# Patient Record
Sex: Male | Born: 1980
Health system: Southern US, Community
[De-identification: ages and names within clinical notes are randomized; demographics above are authoritative.]

## PROBLEM LIST (undated history)

## (undated) DIAGNOSIS — E079 Disorder of thyroid, unspecified: Secondary | ICD-10-CM

## (undated) DIAGNOSIS — E559 Vitamin D deficiency, unspecified: Secondary | ICD-10-CM

## (undated) DIAGNOSIS — T7840XA Allergy, unspecified, initial encounter: Secondary | ICD-10-CM

## (undated) HISTORY — DX: Vitamin D deficiency, unspecified: E55.9

## (undated) HISTORY — PX: NO PAST SURGERIES: SHX2092

## (undated) HISTORY — DX: Allergy, unspecified, initial encounter: T78.40XA

---

## 2006-05-06 ENCOUNTER — Ambulatory Visit: Payer: Self-pay | Admitting: Family Medicine

## 2006-05-16 ENCOUNTER — Ambulatory Visit: Payer: Self-pay | Admitting: Family Medicine

## 2006-11-07 ENCOUNTER — Ambulatory Visit: Payer: Self-pay | Admitting: Family Medicine

## 2006-12-04 DIAGNOSIS — F988 Other specified behavioral and emotional disorders with onset usually occurring in childhood and adolescence: Secondary | ICD-10-CM | POA: Insufficient documentation

## 2007-03-07 ENCOUNTER — Encounter: Payer: Self-pay | Admitting: Family Medicine

## 2007-04-11 ENCOUNTER — Ambulatory Visit: Payer: Self-pay | Admitting: Family Medicine

## 2007-04-14 ENCOUNTER — Telehealth (INDEPENDENT_AMBULATORY_CARE_PROVIDER_SITE_OTHER): Payer: Self-pay | Admitting: *Deleted

## 2007-04-14 LAB — CONVERTED CEMR LAB: T4, Total: 6.9 ug/dL (ref 5.0–12.5)

## 2007-04-17 ENCOUNTER — Encounter: Admission: RE | Admit: 2007-04-17 | Discharge: 2007-04-17 | Payer: Self-pay | Admitting: Family Medicine

## 2007-04-20 ENCOUNTER — Encounter (INDEPENDENT_AMBULATORY_CARE_PROVIDER_SITE_OTHER): Payer: Self-pay | Admitting: Family Medicine

## 2007-04-21 ENCOUNTER — Telehealth (INDEPENDENT_AMBULATORY_CARE_PROVIDER_SITE_OTHER): Payer: Self-pay | Admitting: *Deleted

## 2007-04-22 ENCOUNTER — Ambulatory Visit: Payer: Self-pay | Admitting: Family Medicine

## 2007-04-22 LAB — CONVERTED CEMR LAB
Basophils Absolute: 0 10*3/uL (ref 0.0–0.1)
Eosinophils Absolute: 0.2 10*3/uL (ref 0.0–0.6)
HCT: 43.6 % (ref 39.0–52.0)
Hemoglobin: 14.8 g/dL (ref 13.0–17.0)
Lymphocytes Relative: 29 % (ref 12.0–46.0)
MCHC: 34 g/dL (ref 30.0–36.0)
MCV: 85.3 fL (ref 78.0–100.0)
Monocytes Absolute: 0.6 10*3/uL (ref 0.2–0.7)
Neutro Abs: 3.4 10*3/uL (ref 1.4–7.7)
Neutrophils Relative %: 58.2 % (ref 43.0–77.0)
RBC: 5.11 M/uL (ref 4.22–5.81)

## 2007-04-23 ENCOUNTER — Telehealth (INDEPENDENT_AMBULATORY_CARE_PROVIDER_SITE_OTHER): Payer: Self-pay | Admitting: *Deleted

## 2007-06-19 ENCOUNTER — Ambulatory Visit: Payer: Self-pay | Admitting: Family Medicine

## 2007-06-24 ENCOUNTER — Telehealth (INDEPENDENT_AMBULATORY_CARE_PROVIDER_SITE_OTHER): Payer: Self-pay | Admitting: *Deleted

## 2007-08-05 IMAGING — US US SOFT TISSUE HEAD/NECK
1 series · 14 of 25 positions shown · non-contrast
Comparison: None.

CLINICAL DATA: Elevated TSH.

THYROID ULTRASOUND
TECHNIQUE: Ultrasound examination of the thyroid gland and adjacent soft tissue
structures was performed.

[Series 1: unknown · 0.09mm/px · 14 of 50 slices shown]
[im 1/50]
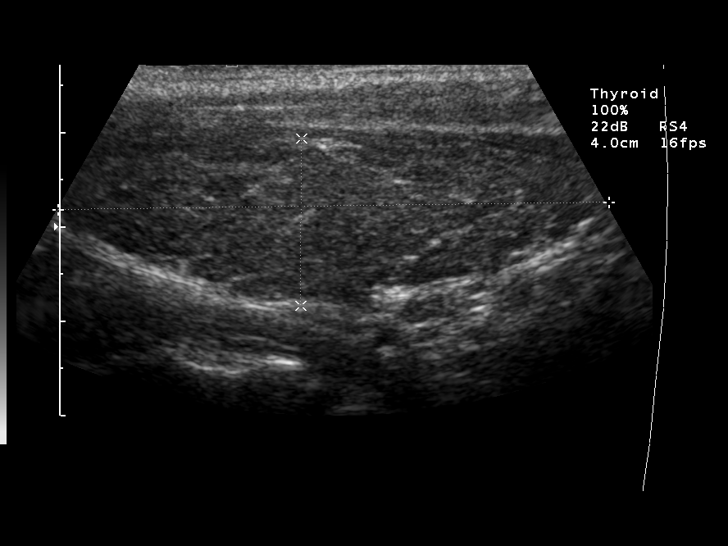
[im 5/50]
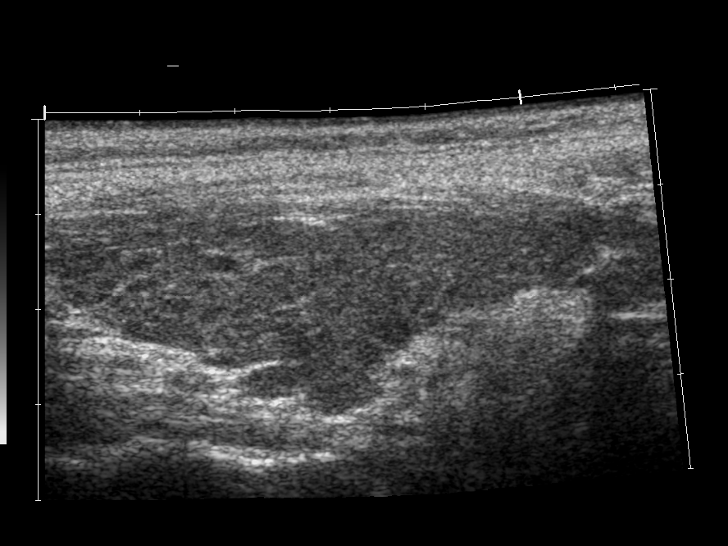
[im 9/50]
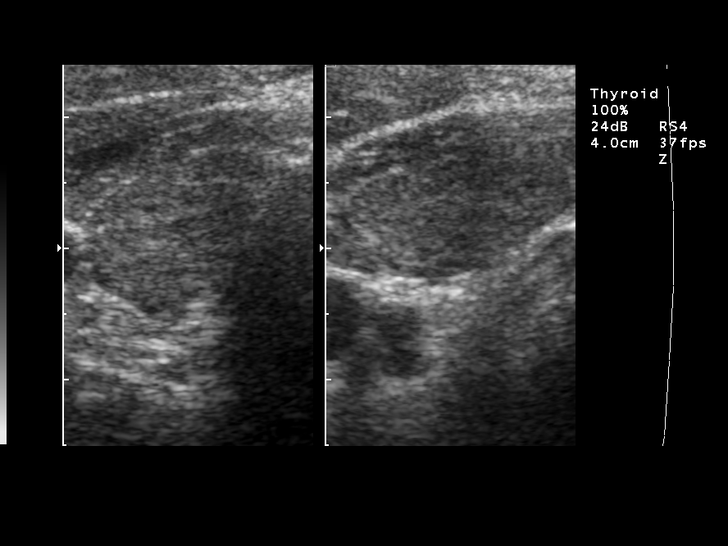
[im 13/50]
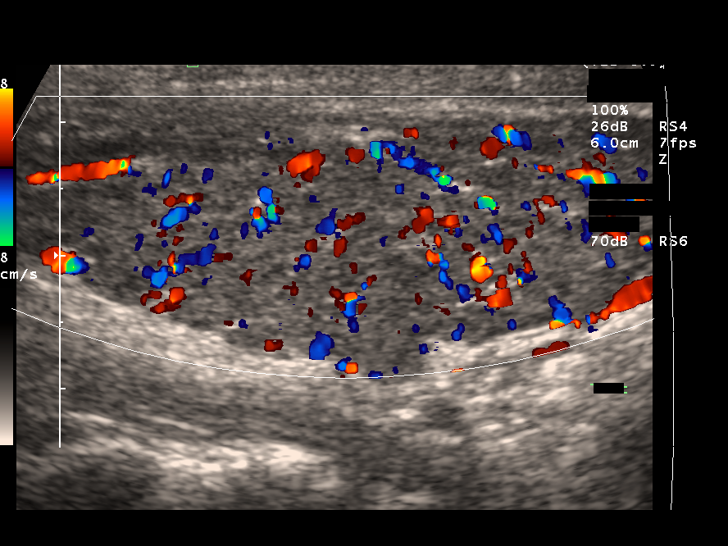
[im 17/50]
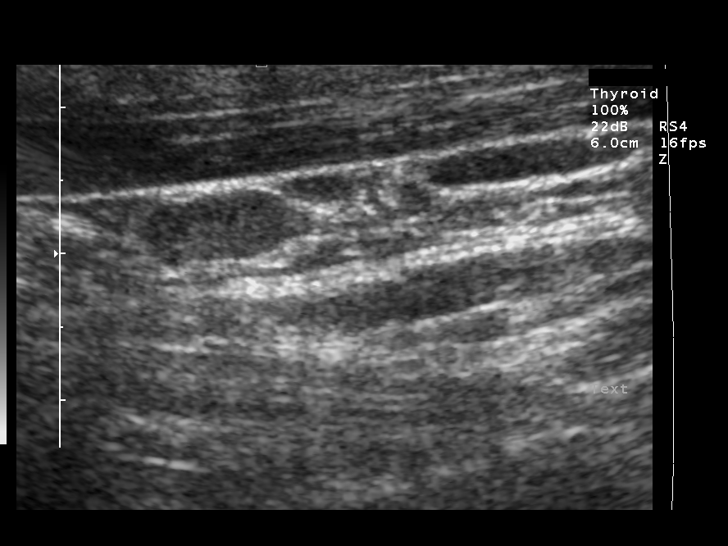
[im 19/50]
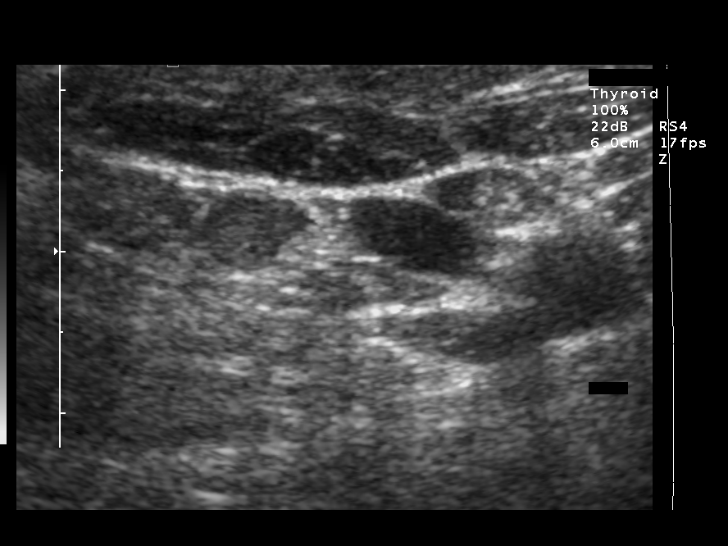
[im 23/50]
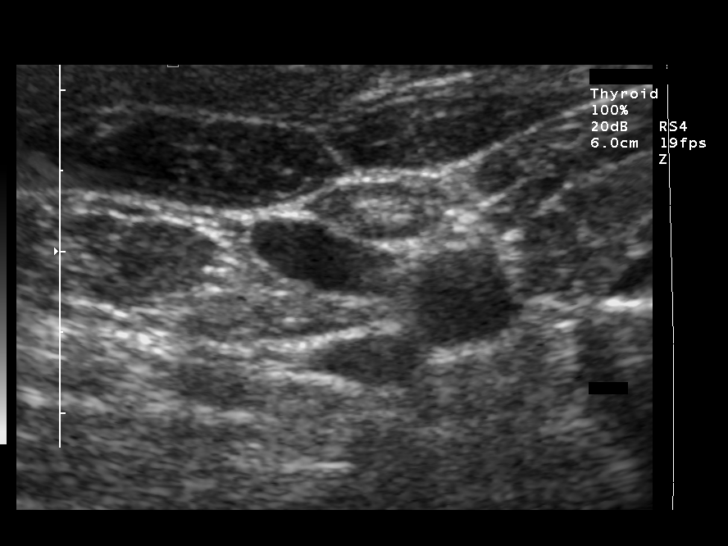
[im 27/50]
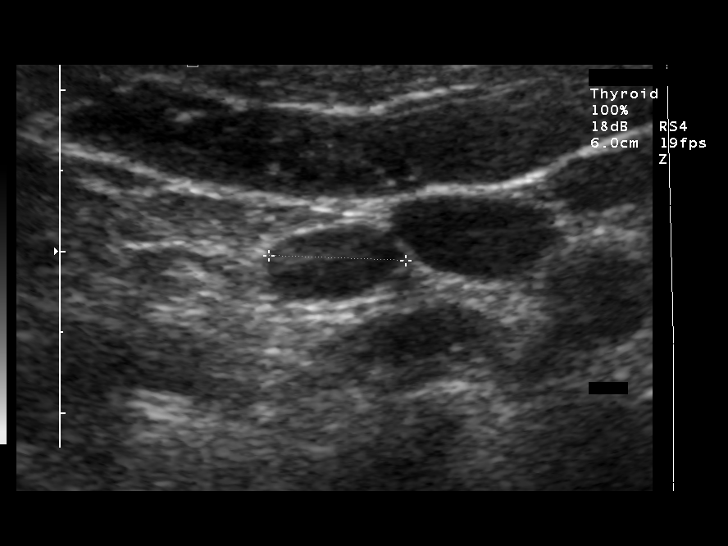
[im 31/50]
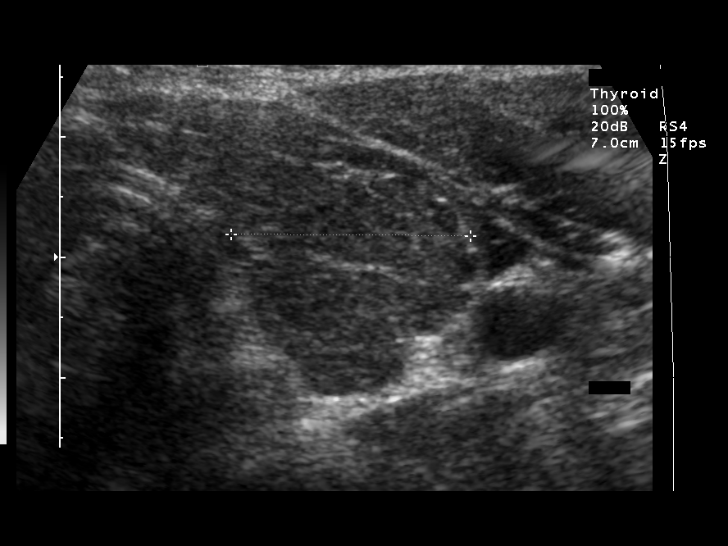
[im 33/50]
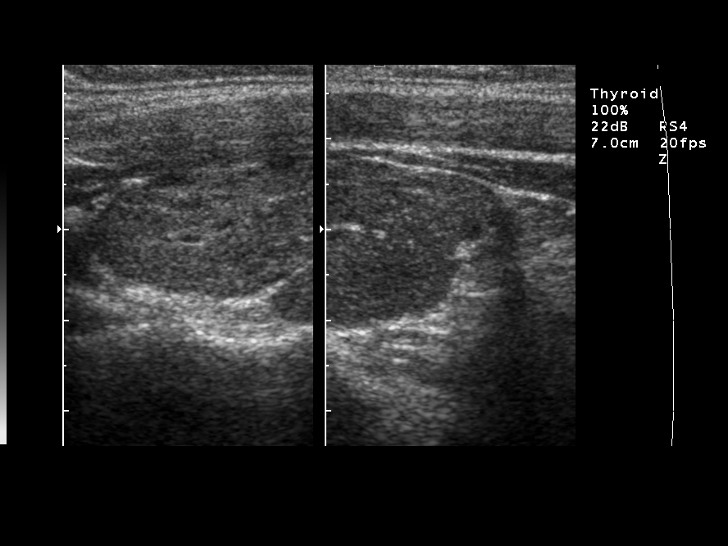
[im 37/50]
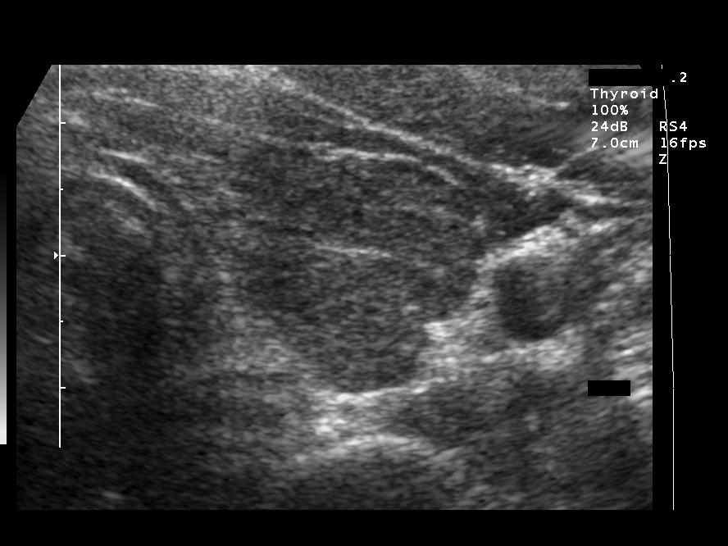
[im 41/50]
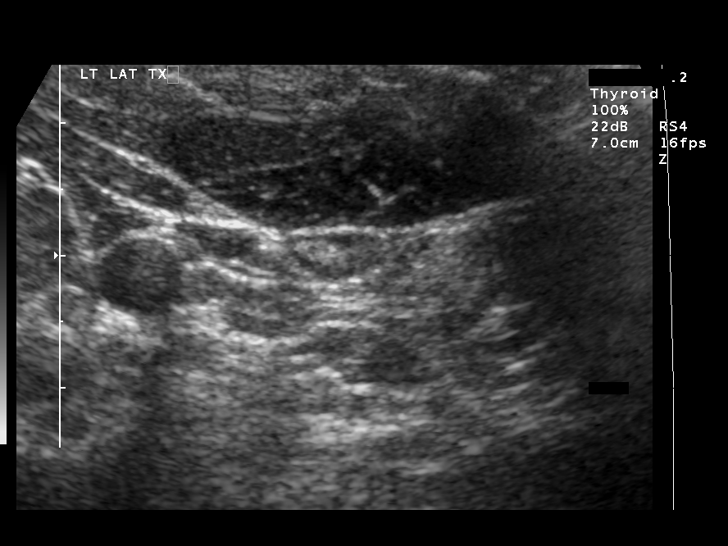
[im 45/50]
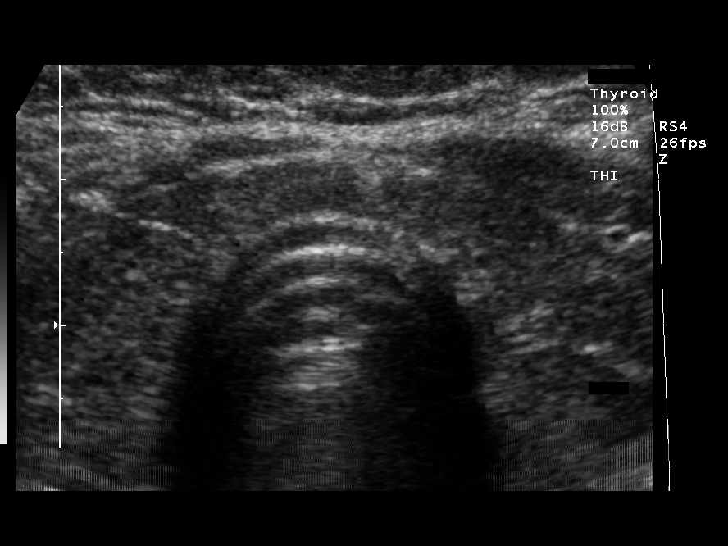
[im 50/50]
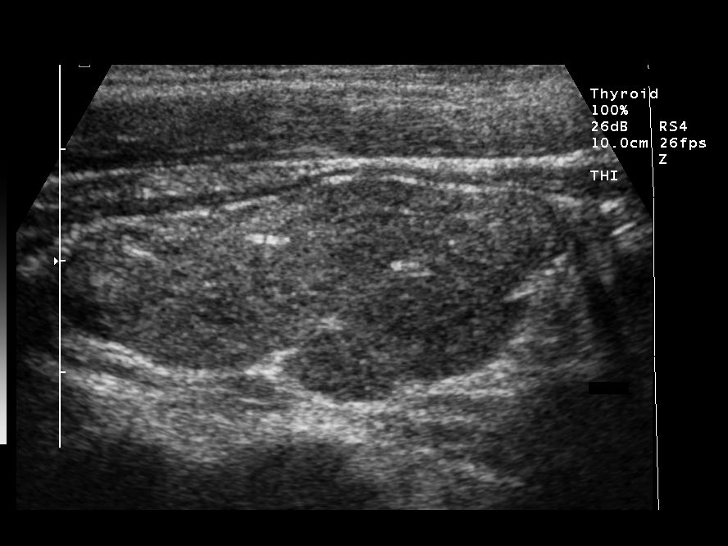

[14 of 25 positions shown; findings below may reference images not displayed]

FINDINGS: Mildly diffusely enlarged and inhomogeneous thyroid gland. No thyroid
nodules identified. Prominent vascularity is also noted throughout the thyroid
gland.

The right lobe measures 6.0 x 2.2 x 1.8 cm in maximum dimensions. The left lobe
measures 5.0 x 2.0 x 2.0 cm in maximum dimensions. The isthmus measures 5.1 mm
in thickness in the midline.

Multiple mildly prominent lymph nodes are noted in the neck bilaterally. These
have normal appearing fatty hila.

IMPRESSION

1. Mild diffuse enlargement and inhomogeneity of the thyroid gland with
prominent vascularity throughout the gland. These findings are compatible with
thyroiditis.

2. Mildly prominent lymph nodes in the neck bilaterally, most likely
representing reactive nodes.

## 2007-12-04 ENCOUNTER — Ambulatory Visit: Payer: Self-pay | Admitting: Family Medicine

## 2007-12-05 ENCOUNTER — Encounter (INDEPENDENT_AMBULATORY_CARE_PROVIDER_SITE_OTHER): Payer: Self-pay | Admitting: *Deleted

## 2007-12-31 ENCOUNTER — Telehealth (INDEPENDENT_AMBULATORY_CARE_PROVIDER_SITE_OTHER): Payer: Self-pay | Admitting: *Deleted

## 2011-03-09 NOTE — Letter (Signed)
November 07, 2006     RE:  DERELLE, COCKRELL  MRN:  [id number]  /  DOB:  1981-06-29   To Whom It May Concern:   I am writing this letter on behalf of Ioannis Schuh, who is a patient at  [facility]. Mr. Popp has a history of attention  deficit disorder, which may have effected his unsuccessful attempt of  passing the [employer] Level 1 state examination. After discussion with Mr.  Lauver and review of his medical history, it is my recommendation that  he be allotted more time if at all possible, twice the minimal  requirement. Hopefully, this will minimize the affect that ADD will have  on his performance on this examination.   If you have any further questions, please do not hesitate to call.    Sincerely,      Leanne Chang, M.D.  Electronically Signed    LA/MedQ  DD: 11/07/2006  DT: 11/07/2006  Job #: [id number]

## 2011-03-09 NOTE — Assessment & Plan Note (Signed)
[facility]                        [facility] OFFICE NOTE   NAME:BARNESBernarr, Longsworth                       MRN:          [id number]  DATE:11/07/2006                            DOB:          May 23, 1981    The patient is a 30 year old male who works for the [employer]. He  failed the [facility] qualification examination. He has a history of  ADD and he states that usually he is able to handle the symptoms just  with lifestyle changes. In the past, he used to be on medications  through his middle school years. He states that he noted that while he  was taking his test he had difficulty concentrating on the questions,  but was more focused on how much time he had left. The test is timed.  This became a major factor in him completing the examination. He was  recommended by a representative of the board to consider having more  time for the test. He would need a letter from his physician stating  that he does have a disability, IE, ADD that would qualify for this  extension.   Aymen has no other concerns. He is otherwise doing well.   MEDICATIONS:  None.   ALLERGIES:  No known drug allergies.   OBJECTIVE:  Weight is 227, pulse 83, blood pressure 120/76.  GENERAL: A pleasant male in no acute distress. Answers questions  appropriately. Is alert and oriented x3.  PSYCH: Speech is regular rate and rhythm. Mood normal. Affect within  normal limits as well.   IMPRESSION:  Attention deficit disorder. Reviewed developmental  evaluation performed when he was a child.   PLAN:  I advised Daegon that I will write a letter to the qualifications  board for extension of the time allotted to take the examination. Weldon  does feel that this would be beneficial for him and he will have no  trouble passing the examination under those conditions.     Leanne Chang, M.D.  Electronically Signed    LA/MedQ  DD: 11/07/2006  DT: 11/07/2006  Job #:  [id number]

## 2011-07-09 ENCOUNTER — Inpatient Hospital Stay
Admission: RE | Admit: 2011-07-09 | Payer: BC Managed Care – PPO | Source: Ambulatory Visit | Admitting: Emergency Medicine

## 2011-07-09 ENCOUNTER — Encounter: Payer: Self-pay | Admitting: Family Medicine

## 2011-07-09 ENCOUNTER — Inpatient Hospital Stay (INDEPENDENT_AMBULATORY_CARE_PROVIDER_SITE_OTHER)
Admission: RE | Admit: 2011-07-09 | Discharge: 2011-07-09 | Disposition: A | Discharge status: Home / Self Care | Payer: BC Managed Care – PPO | Source: Ambulatory Visit | Attending: Family Medicine | Admitting: Family Medicine

## 2011-07-09 DIAGNOSIS — J069 Acute upper respiratory infection, unspecified: Secondary | ICD-10-CM

## 2011-09-14 ENCOUNTER — Encounter: Payer: Self-pay | Admitting: *Deleted

## 2011-09-14 ENCOUNTER — Emergency Department
Admission: EM | Admit: 2011-09-14 | Discharge: 2011-09-14 | Disposition: A | Discharge status: Home / Self Care | Payer: BC Managed Care – PPO | Source: Home / Self Care | Attending: Family Medicine | Admitting: Family Medicine

## 2011-09-14 DIAGNOSIS — J069 Acute upper respiratory infection, unspecified: Secondary | ICD-10-CM

## 2011-09-14 HISTORY — DX: Disorder of thyroid, unspecified: E07.9

## 2011-09-14 MED ORDER — BENZONATATE 200 MG PO CAPS: 200.0000 mg | ORAL_CAPSULE | Freq: Every day | ORAL | Status: AC

## 2011-09-14 MED ORDER — AZITHROMYCIN 250 MG PO TABS: ORAL_TABLET | ORAL | Status: AC

## 2011-09-14 NOTE — ED Notes (Signed)
Pt c/o productive cough and head congestion x 3 days. No fever. He has taken Mucinex D.

## 2011-09-14 NOTE — ED Provider Notes (Signed)
History     CSN: [id number] Arrival date & time: 09/14/2011 11:35 AM   First MD Initiated Contact with Patient 09/14/11 1200      Chief Complaint  Patient presents with  . Cough     HPI Comments: Patient complains of approximately 5 day history of gradually progressive mild URI symptoms beginning with a mild sore throat (now improved), followed by progressive nasal congestion.  A cough started about 4 days ago.  Complains of fatigue but no myalgias.  Cough is now worse at night and generally non-productive during the day.  There has been no pleuritic pain, shortness of breath, or wheezes.   Patient is a 30 y.o. male presenting with URI. The history is provided by the patient.  URI The primary symptoms include fatigue and cough. Primary symptoms do not include fever, headaches, ear pain, sore throat, swollen glands, wheezing, abdominal pain, nausea, vomiting, myalgias or rash. The current episode started 3 to 5 days ago. This is a new problem.  Symptoms associated with the illness include congestion and rhinorrhea. The illness is not associated with chills, plugged ear sensation, facial pain or sinus pressure.    Past Medical History  Diagnosis Date  . Thyroid disease     History reviewed. No pertinent past surgical history.  Family History  Problem Relation Age of Onset  . Hypertension Father   . Diabetes Father   . Heart failure Father     History  Substance Use Topics  . Smoking status: Never Smoker   . Smokeless tobacco: Not on file  . Alcohol Use: No      Review of Systems  Constitutional: Positive for fatigue. Negative for fever and chills.  HENT: Positive for congestion and rhinorrhea. Negative for ear pain, sore throat and sinus pressure.   Respiratory: Positive for cough. Negative for wheezing.   Gastrointestinal: Negative for nausea, vomiting and abdominal pain.  Musculoskeletal: Negative for myalgias.  Skin: Negative for rash.  Neurological: Negative for  headaches.    Allergies  Review of patient's allergies indicates no known allergies.  Home Medications   Current Outpatient Rx  Name Route Sig Dispense Refill  . CETIRIZINE HCL 10 MG PO TABS Oral Take 10 mg by mouth daily.      Marland Kitchen LEVOTHYROXINE SODIUM 100 MCG PO TABS Oral Take 100 mcg by mouth daily.      . AZITHROMYCIN 250 MG PO TABS  Take 2 tabs today; then begin one tab once daily for 4 more days. (Rx void after 09/21/11)    6 each 0  . BENZONATATE 200 MG PO CAPS Oral Take 1 capsule (200 mg total) by mouth at bedtime. Take as needed for cough 12 capsule 0    BP 125/76  Pulse 84  Temp(Src) 98.4 F (36.9 C) (Oral)  Resp 16  Ht 5\' 9"  (1.753 m)  Wt 227 lb 12 oz (103.307 kg)  BMI 33.63 kg/m2  SpO2 98%  Physical Exam  Nursing note and vitals reviewed. Constitutional: He is oriented to person, place, and time. He appears well-developed and well-nourished. No distress.  HENT:  Head: Normocephalic.  Right Ear: External ear normal.  Left Ear: External ear normal.  Nose: Nose normal.  Mouth/Throat: Oropharynx is clear and moist. No oropharyngeal exudate.  Eyes: Conjunctivae and EOM are normal. Pupils are equal, round, and reactive to light. Right eye exhibits no discharge. Left eye exhibits no discharge.  Neck: Normal range of motion. Neck supple.  Cardiovascular: Normal rate, regular rhythm and  normal heart sounds.   Pulmonary/Chest: Effort normal and breath sounds normal. No stridor. No respiratory distress. He has no wheezes. He has no rales. He exhibits no tenderness.  Abdominal: Soft. Bowel sounds are normal. He exhibits no distension. There is no tenderness.  Musculoskeletal: He exhibits no edema.  Lymphadenopathy:    He has no cervical adenopathy.  Neurological: He is alert and oriented to person, place, and time.  Skin: Skin is warm and dry. He is not diaphoretic.    ED Course  Procedures none      1. Acute upper respiratory infections of unspecified site        MDM  There is no evidence of bacterial infection today.  Suspect viral URI. Treat symptomatically for now:  Increase fluid intake, begin expectorant/decongestant, topical decongestant, saline nasal spray/saline irrigation, cough suppressant at bedtime. If fever/chills persist, or if not improving 5 days begin Z-pack (given Rx to hold).  Followup with PCP if not improving 7 to 10 days.     Donna Christen, MD 09/18/11 [phone]

## 2011-09-24 NOTE — Progress Notes (Signed)
Summary: SINUS DRAINAGE rm 4   Vital Signs:  Patient Profile:   30 Years Old Male CC:      sinus drainage x 2 days Height:     69 inches Weight:      231.50 pounds O2 Sat:      99 % O2 treatment:    Room Air Temp:     98.4 degrees F oral Pulse rate:   88 / minute Resp:     18 per minute BP sitting:   113 / 75  (left arm) Cuff size:   regular  Vitals Entered By: Clemens Catholic LPN (July 09, 2011 1:41 PM)                  Updated Prior Medication List: LEVOTHROID 25 MCG TABS (LEVOTHYROXINE SODIUM)  DAILY MULTI  TABS (MULTIPLE VITAMINS-MINERALS)  ZYRTEC HIVES RELIEF 10 MG TABS (CETIRIZINE HCL)  vitamin D vitamin C  Current Allergies: ! PREDNISONEHistory of Present Illness Chief Complaint: sinus drainage x 2 days History of Present Illness:  Subjective: Patient complains of sinus congestion and scratchy throat for two days.  He has a history of seasonal allergies and is taking Zyrtec D. No cough No pleuritic pain No wheezing ? post-nasal drainage No sinus pain/pressure No itchy/red eyes No earache No hemoptysis No SOB No fever/chills No nausea No vomiting No abdominal pain No diarrhea No skin rashes + fatigue No myalgias + headache    REVIEW OF SYSTEMS Constitutional Symptoms      Denies fever, chills, night sweats, weight loss, weight gain, and fatigue.  Eyes       Denies change in vision, eye pain, eye discharge, glasses, contact lenses, and eye surgery. Ear/Nose/Throat/Mouth       Complains of frequent runny nose and sinus problems.      Denies hearing loss/aids, change in hearing, ear pain, ear discharge, dizziness, frequent nose bleeds, sore throat, hoarseness, and tooth pain or bleeding.  Respiratory       Denies dry cough, productive cough, wheezing, shortness of breath, asthma, bronchitis, and emphysema/COPD.  Cardiovascular       Denies murmurs, chest pain, and tires easily with exhertion.    Gastrointestinal       Denies stomach pain,  nausea/vomiting, diarrhea, constipation, blood in bowel movements, and indigestion. Genitourniary       Denies painful urination, kidney stones, and loss of urinary control. Neurological       Denies paralysis, seizures, and fainting/blackouts. Musculoskeletal       Denies muscle pain, joint pain, joint stiffness, decreased range of motion, redness, swelling, muscle weakness, and gout.  Skin       Denies bruising, unusual mles/lumps or sores, and hair/skin or nail changes.  Psych       Denies mood changes, temper/anger issues, anxiety/stress, speech problems, depression, and sleep problems. Other Comments: pt c/o head congestion x 2 days. he has a hx of sinus infections. no OTC meds, no fever.    Past History:  Past Medical History: Hypothyroidism ADD  Past Surgical History: Denies surgical history  Family History: mother- fibromyalgia, degenerative disc father- DM, MD  Social History: Never Smoked Alcohol use-no Drug use-no Drug Use:  no   Objective:  Appearance:  Patient appears healthy, stated age, and in no acute distress  Eyes:  Pupils are equal, round, and reactive to light and accomodation.  Extraocular movement is intact.  Conjunctivae are not inflamed.  Ears:  Canals normal.  Tympanic membranes normal.   Nose:  Mildly congested turbinates.  No sinus tenderness  Pharynx:  Normal  Neck:  Supple.  Slightly tender shotty posterior nodes are palpated bilaterally.  Lungs:  Clear to auscultation.  Breath sounds are equal.  Heart:  Regular rate and rhythm without murmurs, rubs, or gallops.  Abdomen:  Nontender without masses or hepatosplenomegaly.  Bowel sounds are present.  No CVA or flank tenderness.  Skin:  No rash Assessment New Problems: UPPER RESPIRATORY INFECTION, ACUTE (ICD-465.9)  NO EVIDENCE BACTERIAL INFECTION TODAY  Plan New Medications/Changes: AZITHROMYCIN 250 MG TABS (AZITHROMYCIN) Two tabs by mouth on day 1, then 1 tab daily on days 2 through 5  (Rx void after 07/17/11)  #6 tabs x 0, 07/09/2011, Donna Christen MD BENZONATATE 200 MG CAPS (BENZONATATE) One by mouth hs as needed cough  #12 x 0, 07/09/2011, Donna Christen MD  New Orders: New Patient Level III [id number] Planning Comments:   Treat symptomatically for now:  Increase fluid intake, begin expectorant/decongestant, topical decongestant,  cough suppressant at bedtime.  If fever/chills/sweats persist, or if not improving 5  days begin Z-pack (given Rx to hold).  Followup with PCP if not improving 7 to 10 days.   Stop Zyrtec D for now.   The patient and/or caregiver has been counseled thoroughly with regard to medications prescribed including dosage, schedule, interactions, rationale for use, and possible side effects and they verbalize understanding.  Diagnoses and expected course of recovery discussed and will return if not improved as expected or if the condition worsens. Patient and/or caregiver verbalized understanding.  Prescriptions: AZITHROMYCIN 250 MG TABS (AZITHROMYCIN) Two tabs by mouth on day 1, then 1 tab daily on days 2 through 5 (Rx void after 07/17/11)  #6 tabs x 0   Entered and Authorized by:   Donna Christen MD   Signed by:   Donna Christen MD on 07/09/2011   Method used:   Print then Give to Patient   RxID:   [id number] BENZONATATE 200 MG CAPS (BENZONATATE) One by mouth hs as needed cough  #12 x 0   Entered and Authorized by:   Donna Christen MD   Signed by:   Donna Christen MD on 07/09/2011   Method used:   Print then Give to Patient   RxID:   [id number]   Patient Instructions: 1)  Take Mucinex D (guaifenesin with decongestant) twice daily for congestion. 2)  Increase fluid intake, rest. 3)  Stop Zyrtec D for now 4)  May use Afrin nasal spray (or generic oxymetazoline) twice daily for about 5 days.  Also recommend using saline nasal spray several times daily and/or saline nasal irrigation. 5)  Begin Azithromycin if not improving about 5 days or if  persistent fever develops. 6)  Followup with family doctor if not improving 7 to 10 days.   Orders Added: 1)  New Patient Level III [02725]

## 2011-09-26 ENCOUNTER — Encounter: Payer: Self-pay | Admitting: Emergency Medicine

## 2011-09-26 ENCOUNTER — Emergency Department (INDEPENDENT_AMBULATORY_CARE_PROVIDER_SITE_OTHER)
Admission: EM | Admit: 2011-09-26 | Discharge: 2011-09-26 | Disposition: A | Discharge status: Home / Self Care | Payer: BC Managed Care – PPO | Source: Home / Self Care | Attending: Emergency Medicine | Admitting: Emergency Medicine

## 2011-09-26 DIAGNOSIS — R509 Fever, unspecified: Secondary | ICD-10-CM

## 2011-09-26 DIAGNOSIS — J111 Influenza due to unidentified influenza virus with other respiratory manifestations: Secondary | ICD-10-CM

## 2011-09-26 DIAGNOSIS — J069 Acute upper respiratory infection, unspecified: Secondary | ICD-10-CM

## 2011-09-26 DIAGNOSIS — R6889 Other general symptoms and signs: Secondary | ICD-10-CM

## 2011-09-26 NOTE — ED Provider Notes (Signed)
History     CSN: [id number] Arrival date & time: 09/26/2011 12:22 PM   First MD Initiated Contact with Patient 09/26/11 1220      Chief Complaint  Patient presents with  . Fever    (Consider location/radiation/quality/duration/timing/severity/associated sxs/prior treatment) HPI Eric Lynch is a 30 y.o. male who complains of onset of cold symptoms for 1 days.  He is mostly concerned because he had a very similar illness about a month ago and lost make sure he is not still sick. He states that his wife and daughter have both been sick over the last week. No sore throat + cough No pleuritic pain No wheezing + nasal congestion + post-nasal drainage + sinus pain/pressure No chest congestion No itchy/red eyes No earache No hemoptysis No SOB No chills/sweats + fever (he states that he had high fever this morning but thinks that his thermometer was malfunctioning because he did not feel that bad at the time) No nausea No vomiting No abdominal pain No diarrhea No skin rashes No fatigue No myalgias No headache    Past Medical History  Diagnosis Date  . Thyroid disease     History reviewed. No pertinent past surgical history.  Family History  Problem Relation Age of Onset  . Hypertension Father   . Diabetes Father   . Heart failure Father     History  Substance Use Topics  . Smoking status: Never Smoker   . Smokeless tobacco: Not on file  . Alcohol Use: No      Review of Systems  Allergies  Prednisone  Home Medications   Current Outpatient Rx  Name Route Sig Dispense Refill  . CETIRIZINE HCL 10 MG PO TABS Oral Take 10 mg by mouth daily.      Marland Kitchen LEVOTHYROXINE SODIUM 100 MCG PO TABS Oral Take 100 mcg by mouth daily.        BP 132/78  Pulse 114  Temp(Src) 98.6 F (37 C) (Oral)  Resp 18  Ht 5\' 9"  (1.753 m)  Wt 227 lb (102.967 kg)  BMI 33.52 kg/m2  SpO2 96%  Physical Exam  Nursing note and vitals reviewed. Constitutional: He is oriented to person,  place, and time. He appears well-developed and well-nourished.  HENT:  Head: Normocephalic and atraumatic.  Right Ear: Tympanic membrane, external ear and ear canal normal.  Left Ear: Tympanic membrane, external ear and ear canal normal.  Nose: Mucosal edema and rhinorrhea present.  Mouth/Throat: Posterior oropharyngeal erythema present. No oropharyngeal exudate or posterior oropharyngeal edema.  Neck: Neck supple.  Cardiovascular: Regular rhythm and normal heart sounds.   Pulmonary/Chest: Effort normal and breath sounds normal. No respiratory distress.  Neurological: He is alert and oriented to person, place, and time.  Skin: Skin is warm and dry.  Psychiatric: He has a normal mood and affect. His speech is normal.    ED Course  Procedures (including critical care time)   Labs Reviewed  POCT INFLUENZA A/B   No results found.   1. Influenza-like illness   2. Acute upper respiratory infections of unspecified site   3. Fever       MDM  1) no prescriptions are given today since his most likely viral with a combination of allergic rhinitis.  2)  Use nasal saline solution (over the counter) at least 3 times a day. 3)  Use over the counter decongestants like Zyrtec-D every 12 hours as needed to help with congestion.  If you have hypertension, do not take medicines with sudafed.  4)  Can take tylenol every 6 hours or motrin every 8 hours for pain or fever. 5)  Follow up with your primary doctor if no improvement in 5-7 days, sooner if increasing pain, fever, or new symptoms.       Lily Kocher, MD 09/26/11 [phone]

## 2011-09-26 NOTE — ED Notes (Signed)
Fever, congestion x 1 day

## 2011-11-19 ENCOUNTER — Encounter: Payer: Self-pay | Admitting: Emergency Medicine

## 2011-11-19 ENCOUNTER — Emergency Department
Admission: EM | Admit: 2011-11-19 | Discharge: 2011-11-19 | Disposition: A | Discharge status: Home / Self Care | Payer: BC Managed Care – PPO | Source: Home / Self Care | Attending: Family Medicine | Admitting: Family Medicine

## 2011-11-19 DIAGNOSIS — L509 Urticaria, unspecified: Secondary | ICD-10-CM

## 2011-11-19 LAB — POCT CBC W AUTO DIFF (K'VILLE URGENT CARE)

## 2011-11-19 NOTE — ED Provider Notes (Signed)
History     CSN: [id number]  Arrival date & time 11/19/11  4098   First MD Initiated Contact with Patient 11/19/11 [phone]      Chief Complaint  Patient presents with  . Urticaria      HPI Comments: Patient states that he had a mild head cold about 1.5 weeks ago, now essentially resolved.  He never had a sore throat or cough.  Two days ago he developed hives/itching on his upper body and face that resolved with Zyrtec 10mg  that he normally takes daily for his seasonal allergies.  Last night he had low grade fever of 99.1.  He feels well otherwise, however.  He denies sore throat, and feels that his rash has resolved.  Patient is a 31 y.o. male presenting with urticaria. The history is provided by the patient.  Urticaria This is a new problem. The current episode started 2 days ago. The problem occurs daily. The problem has been resolved. Pertinent negatives include no chest pain, no abdominal pain, no headaches and no shortness of breath. The symptoms are aggravated by nothing. Relieved by: Zyrtec.    Past Medical History  Diagnosis Date  . Thyroid disease     History reviewed. No pertinent past surgical history.  Family History  Problem Relation Age of Onset  . Hypertension Father   . Diabetes Father   . Heart failure Father     History  Substance Use Topics  . Smoking status: Never Smoker   . Smokeless tobacco: Not on file  . Alcohol Use: No      Review of Systems  Respiratory: Negative for shortness of breath.   Cardiovascular: Negative for chest pain.  Gastrointestinal: Negative for abdominal pain.  Neurological: Negative for headaches.   No sore throat No cough No pleuritic pain No wheezing + mild nasal congestion No post-nasal drainage No sinus pain/pressure No itchy/red eyes No earache No hemoptysis No SOB + low grade fever/chills No nausea No vomiting No abdominal pain No diarrhea No urinary symptoms + skin rash No fatigue No myalgias No  headache Used Zyrtec with relief of rash and itching. Allergies  Prednisone  Home Medications   Current Outpatient Rx  Name Route Sig Dispense Refill  . DIPHENHYDRAMINE HCL 25 MG PO CAPS Oral Take 25 mg by mouth every 6 (six) hours as needed.    Marland Kitchen CETIRIZINE HCL 10 MG PO TABS Oral Take 10 mg by mouth daily.      Marland Kitchen LEVOTHYROXINE SODIUM 100 MCG PO TABS Oral Take 100 mcg by mouth daily.        BP 123/85  Pulse 106  Temp(Src) 98.1 F (36.7 C) (Oral)  Resp 16  Ht 5\' 9"  (1.753 m)  Wt 229 lb (103.874 kg)  BMI 33.82 kg/m2  SpO2 99%  Physical Exam Nursing notes and Vital Signs reviewed. Appearance:  Patient appears healthy, stated age, and in no acute distress Eyes:  Pupils are equal, round, and reactive to light and accomodation.  Extraocular movement is intact.  Conjunctivae are not inflamed  Ears:  Canals normal.  Tympanic membranes normal.  Nose:  Minimally congested turbinates.  No sinus tenderness.   Mouth:  No lesions.  Normal teeth and gingiva without tenderness Pharynx:  Normal Neck:  Supple.  No adenopathy  Lungs:  Clear to auscultation.  Breath sounds are equal.  Heart:  Regular rate and rhythm without murmurs, rubs, or gallops.  Abdomen:  Nontender without masses or hepatosplenomegaly.  Bowel sounds are present.  No CVA or flank tenderness.  Extremities:  No edema.  No calf tenderness Skin:  No rash present.   ED Course  Procedures  none   Labs Reviewed  POCT CBC W AUTO DIFF ([facility]):  CBC:  WBC 10.9; LY 17.0; MO 3.0; GR 80.0; Hgb 16.0       1. Urticaria       MDM  Normal physical exam today; no obvious etiology for his urticaria although symptoms appear to have resolved.  With minimally elevated WBC, may be a viral syndrome. Recommend continuing Zyrtec.  Monitor temperature.  If fever persists followup with Family Doctor        Donna Christen, MD 11/19/11 1011

## 2011-11-19 NOTE — ED Notes (Signed)
Hives started yesterday while at work, he has had some sinus congestion, thinks it might be related.

## 2011-11-21 ENCOUNTER — Emergency Department
Admission: EM | Admit: 2011-11-21 | Discharge: 2011-11-21 | Disposition: A | Discharge status: Home / Self Care | Payer: BC Managed Care – PPO | Source: Home / Self Care | Attending: Emergency Medicine | Admitting: Emergency Medicine

## 2011-11-21 ENCOUNTER — Encounter: Payer: Self-pay | Admitting: *Deleted

## 2011-11-21 DIAGNOSIS — R21 Rash and other nonspecific skin eruption: Secondary | ICD-10-CM

## 2011-11-21 DIAGNOSIS — J069 Acute upper respiratory infection, unspecified: Secondary | ICD-10-CM

## 2011-11-21 MED ORDER — AZITHROMYCIN 250 MG PO TABS: 250.0000 mg | ORAL_TABLET | Freq: Every day | ORAL | Status: AC

## 2011-11-21 NOTE — ED Notes (Signed)
Patient c/o rash and swelling that presents at night. Also c/o low grade fever.

## 2011-11-21 NOTE — ED Provider Notes (Signed)
History     CSN: [id number]  Arrival date & time 11/21/11  1131   First MD Initiated Contact with Patient 11/21/11 1146      Chief Complaint  Patient presents with  . Rash    (Consider location/radiation/quality/duration/timing/severity/associated sxs/prior treatment) HPI This patient was here earlier this week periods please see previous note for details. However a short he has had a head cold and mild upper respiratory symptoms. He is still having intermittent fevers low grade about 100.3 or 100.4. He's also had a rash all over  his body which tends to resolve and worsen. It tends to do better with Zyrtec and/or Benadryl. No recent changes in soaps, shampoos, detergents. They do have a new dog but he has never been allergic to dogs. He also works at the [employer] and is exposed to many people throughout the day.  Past Medical History  Diagnosis Date  . Thyroid disease     History reviewed. No pertinent past surgical history.  Family History  Problem Relation Age of Onset  . Hypertension Father   . Diabetes Father   . Heart failure Father     History  Substance Use Topics  . Smoking status: Never Smoker   . Smokeless tobacco: Not on file  . Alcohol Use: No      Review of Systems  Allergies  Prednisone  Home Medications   Current Outpatient Rx  Name Route Sig Dispense Refill  . AZITHROMYCIN 250 MG PO TABS Oral Take 1 tablet (250 mg total) by mouth daily. Take first 2 tablets together, then 1 every day until finished. 6 tablet 0  . CETIRIZINE HCL 10 MG PO TABS Oral Take 10 mg by mouth daily.      Marland Kitchen DIPHENHYDRAMINE HCL 25 MG PO CAPS Oral Take 25 mg by mouth every 6 (six) hours as needed.    Marland Kitchen LEVOTHYROXINE SODIUM 100 MCG PO TABS Oral Take 100 mcg by mouth daily.        BP 123/79  Pulse 122  Temp(Src) 99.3 F (37.4 C) (Oral)  Resp 16  Ht 5\' 9"  (1.753 m)  Wt 229 lb (103.874 kg)  BMI 33.82 kg/m2  SpO2 97%  Physical Exam  Nursing note and vitals  reviewed. Constitutional: He is oriented to person, place, and time. He appears well-developed and well-nourished.  HENT:  Head: Normocephalic and atraumatic.  Right Ear: Tympanic membrane, external ear and ear canal normal.  Left Ear: Tympanic membrane, external ear and ear canal normal.  Nose: Mucosal edema and rhinorrhea present.  Mouth/Throat: Posterior oropharyngeal erythema present. No oropharyngeal exudate or posterior oropharyngeal edema.  Eyes: No scleral icterus.  Neck: Neck supple.  Cardiovascular: Regular rhythm and normal heart sounds.   Pulmonary/Chest: Effort normal and breath sounds normal. No respiratory distress.  Neurological: He is alert and oriented to person, place, and time.  Skin: Skin is warm and dry. No rash noted.  Psychiatric: He has a normal mood and affect. His speech is normal.    ED Course  Procedures (including critical care time)   Labs Reviewed  POCT RAPID STREP A (OFFICE)  POCT RAPID STREP A (OFFICE)  STREP A DNA PROBE   No results found.   1. Rash   2. Acute upper respiratory infections of unspecified site       MDM   Differential diagnosis includes a strep infection versus some type of dermatitis. However with the fever, I'm going to treat with an antibiotic to make sure this  is not a strep pharyngitis despite the negative strep test. A throat culture was sent. I advised him also use Zyrtec in the morning and Benadryl each bedtime. If he is not improving, we can consider prednisone, however he states that he does not do very well with prednisone so may be more appropriate to send him to his primary care physician.    Lily Kocher, MD 11/21/11 [phone]

## 2012-11-28 ENCOUNTER — Ambulatory Visit: Payer: BC Managed Care – PPO | Admitting: Sports Medicine

## 2014-02-06 DIAGNOSIS — E039 Hypothyroidism, unspecified: Secondary | ICD-10-CM | POA: Insufficient documentation

## 2014-02-06 DIAGNOSIS — E559 Vitamin D deficiency, unspecified: Secondary | ICD-10-CM | POA: Insufficient documentation

## 2014-02-06 DIAGNOSIS — T7840XA Allergy, unspecified, initial encounter: Secondary | ICD-10-CM | POA: Insufficient documentation

## 2014-02-07 ENCOUNTER — Encounter: Payer: Self-pay | Admitting: Internal Medicine

## 2014-02-07 NOTE — Progress Notes (Signed)
Patient ID: Eric ChuteJason C Wohlford, male   DOB: November 25, 1980, 33 y.o.   MRN: [id number]   Annual Screening Comprehensive Examination  This very nice 33 y.o.  male presents for complete physical.  Patient has been followed for Hypothyroidism, ADD, Allergies and Vitamin D Deficiency.  Patient has Hypothyroidism predating since 2009 and has been monitored annually with compensation in the normal range.   Patient's BP has been controlled.Today's BP: 130/80 mmHg. Patient denies any cardiac symptoms as chest pain, palpitations, shortness of breath, dizziness or ankle swelling.    Finally, patient has history of Vitamin D Deficiency of 26 in 2010 with last vitamin D 89 in Apr 2014  Medication Sig  . cetirizine  10 mg Take 10 mg by mouth daily.     Levothyroxine 100 mcg 1 daily   B Complex  Takes 1 daily   Vitamin D 5000 u x 3 Takes 15,000 u / da   Vit C 500 mg Takes 1 daily  . diphenhydrAMINE (BENADRYL) 25 mg capsule Take 25 mg by mouth every 6 (six) hours as needed.   Allergies  Allergen Reactions  . Prednisone     Past Medical History  Diagnosis Date  . Thyroid disease   . Vitamin D deficiency   . Allergy     Past Surgical History  Procedure Laterality Date  . No past surgeries     Family History  Problem Relation Age of Onset  . Hypertension Father   . Diabetes Father   . Heart failure Father    History   Social History  . Marital Status: Married    Spouse Name: N/A    Number of Children:  1 son 788 yo  . Years of Education: N/A   Occupational History  [place]   Social History Main Topics  . Smoking status: Never Smoker   . Smokeless tobacco: Not on file  . Alcohol Use: No  . Drug Use: No  . Sexual Activity: Not on file     ROS Constitutional: Denies fever, chills, weight loss/gain, headaches, insomnia, fatigue, night sweats, and change in appetite. Eyes: Denies redness, blurred vision, diplopia, discharge, itchy, watery eyes.  ENT: Denies  discharge, congestion, post nasal drip, epistaxis, sore throat, earache, hearing loss, dental pain, Tinnitus, Vertigo, Sinus pain, snoring.  Cardio: Denies chest pain, palpitations, irregular heartbeat, syncope, dyspnea, diaphoresis, orthopnea, PND, claudication, edema Respiratory: denies cough, dyspnea, DOE, pleurisy, hoarseness, laryngitis, wheezing.  Gastrointestinal: Denies dysphagia, heartburn, reflux, water brash, pain, cramps, nausea, vomiting, bloating, diarrhea, constipation, hematemesis, melena, hematochezia, jaundice, hemorrhoids Genitourinary: Denies dysuria, frequency, urgency, nocturia, hesitancy, discharge, hematuria, flank pain Musculoskeletal: Denies arthralgia, myalgia, stiffness, Jt. Swelling, pain, limp, and strain/sprain. Skin: Denies puritis, rash, hives, warts, acne, eczema, changing in skin lesion Neuro: No weakness, tremor, incoordination, spasms, paresthesia, pain Psychiatric: Denies confusion, memory loss, sensory loss Endocrine: Denies change in weight, skin, hair change, nocturia, and paresthesia, diabetic polys, visual blurring, hyper / hypo glycemic episodes.  Heme/Lymph: No excessive bleeding, bruising, or elarged lymph nodes.  Physical Exam  BP 130/80  Pulse 76  Temp(Src) 98.1 F (36.7 C) (Temporal)  Resp 16  Ht 6' 0.25" (1.835 m)  Wt 229 lb 12.8 oz (104.237 kg)  BMI 30.96 kg/m2  General Appearance: Well nourished, in no apparent distress. Eyes: PERRLA, EOMs, conjunctiva no swelling or erythema, normal fundi and vessels. Sinuses: No frontal/maxillary tenderness ENT/Mouth: EACs patent / TMs  nl. Nares clear without erythema, swelling, mucoid exudates. Oral hygiene is good.  No erythema, swelling, or exudate. Tongue normal, non-obstructing. Tonsils not swollen or erythematous. Hearing normal.  Neck: Supple, thyroid normal. No bruits, nodes or JVD. Respiratory: Respiratory effort normal.  BS equal and clear bilateral without rales, rhonci, wheezing or  stridor. Cardio: Heart sounds are normal with regular rate and rhythm and no murmurs, rubs or gallops. Peripheral pulses are normal and equal bilaterally without edema. No aortic or femoral bruits. Chest: symmetric with normal excursions and percussion.  Abdomen: Flat, soft, with bowl sounds. Nontender, no guarding, rebound, hernias, masses, or organomegaly.  Lymphatics: Non tender without lymphadenopathy.  Genitourinary: No hernias.Testes nl. Musculoskeletal: Full ROM all peripheral extremities, joint stability, 5/5 strength, and normal gait. Skin: Warm and dry without rashes, lesions, cyanosis, clubbing or  ecchymosis.  Neuro: Cranial nerves intact, reflexes equal bilaterally. Normal muscle tone, no cerebellar symptoms. Sensation intact.  Pysch: Awake and oriented X 3, normal affect, insight and judgment appropriate.   Assessment and Plan  1. Annual Screening Examination 2. Hypothyroidism 3. Obesity 4. Vitamin D Deficiency  Continue prudent diet as discussed, weight control, BP monitoring, regular exercise, and medications as discussed.  Discussed med effects and SE's. Routine screening labs and tests as requested with regular follow-up as recommended.

## 2014-02-07 NOTE — Patient Instructions (Signed)

## 2014-02-08 ENCOUNTER — Telehealth: Payer: Self-pay | Admitting: *Deleted

## 2014-02-08 ENCOUNTER — Ambulatory Visit (INDEPENDENT_AMBULATORY_CARE_PROVIDER_SITE_OTHER): Payer: BC Managed Care – PPO | Admitting: Internal Medicine

## 2014-02-08 VITALS — BP 130/80 | HR 76 | Temp 98.1°F | Resp 16 | Ht 72.25 in | Wt 229.8 lb

## 2014-02-08 DIAGNOSIS — R740 Nonspecific elevation of levels of transaminase and lactic acid dehydrogenase [LDH]: Secondary | ICD-10-CM

## 2014-02-08 DIAGNOSIS — E559 Vitamin D deficiency, unspecified: Secondary | ICD-10-CM

## 2014-02-08 DIAGNOSIS — R7402 Elevation of levels of lactic acid dehydrogenase (LDH): Secondary | ICD-10-CM

## 2014-02-08 DIAGNOSIS — Z79899 Other long term (current) drug therapy: Secondary | ICD-10-CM

## 2014-02-08 DIAGNOSIS — Z0000 Encounter for general adult medical examination without abnormal findings: Secondary | ICD-10-CM

## 2014-02-08 DIAGNOSIS — E669 Obesity, unspecified: Secondary | ICD-10-CM | POA: Insufficient documentation

## 2014-02-08 DIAGNOSIS — Z1212 Encounter for screening for malignant neoplasm of rectum: Secondary | ICD-10-CM

## 2014-02-08 DIAGNOSIS — R7401 Elevation of levels of liver transaminase levels: Secondary | ICD-10-CM

## 2014-02-08 DIAGNOSIS — Z113 Encounter for screening for infections with a predominantly sexual mode of transmission: Secondary | ICD-10-CM

## 2014-02-08 LAB — CBC WITH DIFFERENTIAL/PLATELET
Basophils Absolute: 0.1 10*3/uL (ref 0.0–0.1)
Basophils Relative: 1 % (ref 0–1)
EOS ABS: 0.3 10*3/uL (ref 0.0–0.7)
EOS PCT: 6 % — AB (ref 0–5)
HCT: 42.8 % (ref 39.0–52.0)
HEMOGLOBIN: 15.2 g/dL (ref 13.0–17.0)
LYMPHS ABS: 2 10*3/uL (ref 0.7–4.0)
Lymphocytes Relative: 38 % (ref 12–46)
MCH: 29.3 pg (ref 26.0–34.0)
MCHC: 35.5 g/dL (ref 30.0–36.0)
MCV: 82.5 fL (ref 78.0–100.0)
MONOS PCT: 7 % (ref 3–12)
Monocytes Absolute: 0.4 10*3/uL (ref 0.1–1.0)
Neutro Abs: 2.5 10*3/uL (ref 1.7–7.7)
Neutrophils Relative %: 48 % (ref 43–77)
PLATELETS: 256 10*3/uL (ref 150–400)
RBC: 5.19 MIL/uL (ref 4.22–5.81)
RDW: 13.7 % (ref 11.5–15.5)
WBC: 5.2 10*3/uL (ref 4.0–10.5)

## 2014-02-08 MED ORDER — ACYCLOVIR 800 MG PO TABS: 800.0000 mg | ORAL_TABLET | Freq: Two times a day (BID) | ORAL | Status: DC

## 2014-02-08 MED ORDER — LEVOTHYROXINE SODIUM 100 MCG PO TABS: 100.0000 ug | ORAL_TABLET | Freq: Every day | ORAL | Status: DC

## 2014-02-08 NOTE — Telephone Encounter (Signed)
REFILL : LEVOTHYROXINE & ACCYCOLIVOR

## 2014-02-09 LAB — BASIC METABOLIC PANEL WITH GFR
BUN: 14 mg/dL (ref 6–23)
CALCIUM: 8.9 mg/dL (ref 8.4–10.5)
CO2: 24 meq/L (ref 19–32)
CREATININE: 0.88 mg/dL (ref 0.50–1.35)
Chloride: 106 mEq/L (ref 96–112)
GFR, Est African American: >89 mL/min
GFR, Est Non African American: >89 mL/min
GLUCOSE: 127 mg/dL — AB (ref 70–99)
Potassium: 4.1 mEq/L (ref 3.5–5.3)
Sodium: 140 mEq/L (ref 135–145)

## 2014-02-09 LAB — HEPATITIS B SURFACE ANTIBODY,QUALITATIVE

## 2014-02-09 LAB — HEPATIC FUNCTION PANEL
ALBUMIN: 4.2 g/dL (ref 3.5–5.2)
ALT: 15 U/L (ref 0–53)
AST: 13 U/L (ref 0–37)
Alkaline Phosphatase: 59 U/L (ref 39–117)
Bilirubin, Direct: 0.1 mg/dL (ref 0.0–0.3)
Indirect Bilirubin: 0.6 mg/dL (ref 0.2–1.2)
Total Bilirubin: 0.7 mg/dL (ref 0.2–1.2)
Total Protein: 6.7 g/dL (ref 6.0–8.3)

## 2014-02-09 LAB — MICROALBUMIN / CREATININE URINE RATIO
Creatinine, Urine: 69.2 mg/dL
MICROALB/CREAT RATIO: 7.2 mg/g (ref 0.0–30.0)
Microalb, Ur: 0.5 mg/dL (ref 0.00–1.89)

## 2014-02-09 LAB — LIPID PANEL
Cholesterol: 115 mg/dL (ref 0–200)
HDL: 33 mg/dL — ABNORMAL LOW (ref >39)
LDL Cholesterol: 53 mg/dL (ref 0–99)
Total CHOL/HDL Ratio: 3.5 Ratio
Triglycerides: 144 mg/dL (ref <150)
VLDL: 29 mg/dL (ref 0–40)

## 2014-02-09 LAB — TSH: TSH: 2.297 u[IU]/mL (ref 0.350–4.500)

## 2014-02-09 LAB — VITAMIN D 25 HYDROXY (VIT D DEFICIENCY, FRACTURES): VIT D 25 HYDROXY: 75 ng/mL (ref 30–89)

## 2014-02-09 LAB — HEPATITIS A ANTIBODY, TOTAL: HEP A TOTAL AB: NONREACTIVE

## 2014-02-09 LAB — VITAMIN B12: Vitamin B-12: 330 pg/mL (ref 211–911)

## 2014-02-09 LAB — MAGNESIUM: MAGNESIUM: 1.8 mg/dL (ref 1.5–2.5)

## 2014-02-09 LAB — TESTOSTERONE: Testosterone: 212 ng/dL — ABNORMAL LOW (ref 300–890)

## 2014-02-09 LAB — HEMOGLOBIN A1C
HEMOGLOBIN A1C: 5.5 % (ref <5.7)
Mean Plasma Glucose: 111 mg/dL (ref <117)

## 2014-02-09 LAB — HIV ANTIBODY (ROUTINE TESTING W REFLEX): HIV 1&2 Ab, 4th Generation: NONREACTIVE

## 2014-02-09 LAB — HEPATITIS B CORE ANTIBODY, TOTAL: HEP B C TOTAL AB: NONREACTIVE

## 2014-02-09 LAB — URINALYSIS, MICROSCOPIC ONLY
Bacteria, UA: NONE SEEN
CASTS: NONE SEEN
CRYSTALS: NONE SEEN
SQUAMOUS EPITHELIAL / LPF: NONE SEEN

## 2014-02-09 LAB — INSULIN, FASTING: INSULIN FASTING, SERUM: 80 u[IU]/mL — AB (ref 3–28)

## 2014-02-09 LAB — HEPATITIS C ANTIBODY: HCV AB: NEGATIVE

## 2014-02-09 LAB — RPR

## 2014-02-10 LAB — HEPATITIS B E ANTIBODY: HEPATITIS BE ANTIBODY: NONREACTIVE

## 2014-07-02 ENCOUNTER — Encounter: Payer: Self-pay | Admitting: Sports Medicine

## 2014-07-02 ENCOUNTER — Ambulatory Visit (INDEPENDENT_AMBULATORY_CARE_PROVIDER_SITE_OTHER): Payer: BC Managed Care – PPO | Admitting: Sports Medicine

## 2014-07-02 VITALS — BP 123/74 | HR 69 | Ht 72.0 in | Wt 226.0 lb

## 2014-07-02 DIAGNOSIS — L910 Hypertrophic scar: Secondary | ICD-10-CM

## 2014-07-02 NOTE — Progress Notes (Signed)
   Subjective:    I'm seeing this patient as a consultation for:  Dr. 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 This has been present for years and he wonders what can be done to minimize the appearance of the scar. Occasionally it bothers him but the pain is minimal and intermittent.  Past medical history, Surgical history, Family history not pertinant except as noted below, Social history, Allergies, and medications have been entered into the medical record, reviewed, and no changes needed.   Review of Systems: No headache, visual changes, nausea, vomiting, diarrhea, constipation, dizziness, abdominal pain, skin rash, fevers, chills, night sweats, weight loss, swollen lymph nodes, body aches, joint swelling, muscle aches, chest pain, shortness of breath, mood changes, visual or auditory hallucinations.   Objective:   General: Well Developed, well nourished, and in no acute distress.  Neuro/Psych: Alert and oriented x3, extra-ocular muscles intact, able to move all 4 extremities, sensation grossly intact. Skin: Warm and dry, no rashes noted. There is a 6-7 cm hypertrophic scar over the deltoid laterally.  Respiratory: Not using accessory muscles, speaking in full sentences, trachea midline.  Cardiovascular: Pulses palpable, no extremity edema. Abdomen: Does not appear distended.  Procedure:  Intralesional Injection of left deltoid hypertrophic scar Consent obtained and verified. Time-out conducted. Noted no overlying erythema, induration, or other signs of local infection. Skin prepped in a sterile fashion. Topical analgesic spray: Ethyl chloride. Completed without difficulty. Meds: A total of 2 cc lidocaine, 2 cc kenalog 40 injected in a fanlike pattern into the entirety of the hypertrophic scar. Pain immediately improved suggesting accurate placement of the medication. Advised to call if  fevers/chills, erythema, induration, drainage, or persistent bleeding.  Impression and Recommendations:   This case required medical decision making of moderate complexity.

## 2014-07-02 NOTE — Patient Instructions (Signed)
°  Scar Minimization °You will have a scar anytime you have surgery and a cut is made in the skin or you have something removed from your skin (mole, skin cancer, cyst). Although scars are unavoidable following surgery, there are ways to minimize their appearance. °It is important to follow all the instructions you receive from your caregiver about wound care. How your wound heals will influence the appearance of your scar. If you do not follow the wound care instructions as directed, complications such as infection may occur. Wound instructions include keeping the wound clean, moist, and not letting the wound form a scab. Some people form scars that are raised and lumpy (hypertrophic) or larger than the initial wound (keloidal). °HOME CARE INSTRUCTIONS  °· Follow wound care instructions as directed. °· Keep the wound clean by washing it with soap and water. °· Keep the wound moist with provided antibiotic cream or petroleum jelly until completely healed. Moisten twice a day for about 2 weeks. °· Get stitches (sutures) taken out at the scheduled time. °· Avoid touching or manipulating your wound unless needed. Wash your hands thoroughly before and after touching your wound. °· Follow all restrictions such as limits on exercise or work. This depends on where your scar is located. °· Keep the scar protected from sunburn. Cover the scar with sunscreen/sunblock with SPF 30 or higher. °· Gently massage the scar using a circular motion to help minimize the appearance of the scar. Do this only after the wound has closed and all the sutures have been removed. °· For hypertrophic or keloidal scars, there are several ways to treat and minimize their appearance. Methods include compression therapy, intralesional corticosteroids, laser therapy, or surgery. These methods are performed by your caregiver. °Remember that the scar may appear lighter or darker than your normal skin color. This difference in color should even out with  time. °SEEK MEDICAL CARE IF:  °· You have a fever. °· You develop signs of infection such as pain, redness, pus, and warmth. °· You have questions or concerns. °Document Released: 03/28/2010 Document Revised: 12/31/2011 Document Reviewed: 03/28/2010 °[employer]® Patient Information ©2015 [employer]. This information is not intended to replace advice given to you by your health care provider. Make sure you discuss any questions you have with your health care provider. ° ° °

## 2014-07-02 NOTE — Assessment & Plan Note (Signed)
Over the left deltoid, intralesional steroid injection performed today. His wife took a picture on her cell phone. Return in one month to compare. Certainly if persistent after another injection or so I can do a full revision of the scar with subcuticular sutures.

## 2014-07-30 ENCOUNTER — Encounter: Payer: Self-pay | Admitting: Sports Medicine

## 2014-07-30 ENCOUNTER — Ambulatory Visit (INDEPENDENT_AMBULATORY_CARE_PROVIDER_SITE_OTHER): Payer: BC Managed Care – PPO | Admitting: Sports Medicine

## 2014-07-30 VITALS — BP 120/73 | HR 64 | Ht 72.0 in | Wt 221.0 lb

## 2014-07-30 DIAGNOSIS — L910 Hypertrophic scar: Secondary | ICD-10-CM
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
## 2014-07-30 NOTE — Assessment & Plan Note (Signed)
Excellent response to initial intralesional steroid injection into hypertrophic scar. Repeat aggressive intralesional steroid injection today. Return in one month. We can continue to repeat intralesional injections until resolution, or until plateau of improvement. If response is inadequate after multiple intralesional injections we could certainly consider surgical excision of the entire scar here in the office.

## 2014-08-30 ENCOUNTER — Encounter: Payer: Self-pay | Admitting: Sports Medicine

## 2014-08-30 ENCOUNTER — Ambulatory Visit (INDEPENDENT_AMBULATORY_CARE_PROVIDER_SITE_OTHER): Payer: BC Managed Care – PPO | Admitting: Sports Medicine

## 2014-08-30 VITALS — BP 116/75 | HR 63 | Ht 72.0 in | Wt 227.0 lb

## 2014-08-30 DIAGNOSIS — L910 Hypertrophic scar: Secondary | ICD-10-CM
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
## 2014-08-30 NOTE — Assessment & Plan Note (Addendum)
Continued excellent improvement of hypertrophic scar after to intralesional steroid injections. Intralesional steroid injection #3 today, scar is essentially nonpalpable and only slightly erythematous today. Non-painful.  Return in one month, he will continue to take pictures every month of the scar for comparison.

## 2014-09-30 ENCOUNTER — Ambulatory Visit (INDEPENDENT_AMBULATORY_CARE_PROVIDER_SITE_OTHER): Payer: BC Managed Care – PPO | Admitting: Sports Medicine

## 2014-09-30 ENCOUNTER — Encounter: Payer: Self-pay | Admitting: Sports Medicine

## 2014-09-30 VITALS — BP 120/83 | HR 75 | Ht 72.0 in | Wt 224.0 lb

## 2014-09-30 DIAGNOSIS — L910 Hypertrophic scar: Secondary | ICD-10-CM

## 2014-09-30 NOTE — Assessment & Plan Note (Signed)
Intralesional injection #4 today. Return in 1 month. We will continue to take pictures each time.
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
## 2014-10-29 ENCOUNTER — Encounter: Payer: Self-pay | Admitting: Sports Medicine

## 2014-10-29 ENCOUNTER — Ambulatory Visit (INDEPENDENT_AMBULATORY_CARE_PROVIDER_SITE_OTHER): Payer: BLUE CROSS/BLUE SHIELD | Admitting: Sports Medicine

## 2014-10-29 VITALS — BP 115/80 | HR 65 | Ht 72.0 in | Wt 225.0 lb

## 2014-10-29 DIAGNOSIS — L910 Hypertrophic scar: Secondary | ICD-10-CM
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
## 2014-10-29 NOTE — Assessment & Plan Note (Signed)
Excellent response after for intralesional injections with near resolution of the scar. There still are some hyperemic areas. At this point we have achieved maximal response. There continues to be the option of having a dermatologist use a skin lightening agent, he will let me know after discussion with his wife. Return as needed.

## 2014-11-02 ENCOUNTER — Ambulatory Visit (INDEPENDENT_AMBULATORY_CARE_PROVIDER_SITE_OTHER): Payer: BLUE CROSS/BLUE SHIELD | Admitting: Sports Medicine

## 2014-11-02 ENCOUNTER — Ambulatory Visit (INDEPENDENT_AMBULATORY_CARE_PROVIDER_SITE_OTHER): Payer: BLUE CROSS/BLUE SHIELD

## 2014-11-02 ENCOUNTER — Encounter: Payer: Self-pay | Admitting: Sports Medicine

## 2014-11-02 VITALS — BP 129/83 | HR 120 | Temp 98.4°F | Ht 72.0 in | Wt 224.0 lb

## 2014-11-02 DIAGNOSIS — Z0000 Encounter for general adult medical examination without abnormal findings: Secondary | ICD-10-CM | POA: Insufficient documentation

## 2014-11-02 DIAGNOSIS — R69 Illness, unspecified: Principal | ICD-10-CM

## 2014-11-02 DIAGNOSIS — J189 Pneumonia, unspecified organism: Secondary | ICD-10-CM

## 2014-11-02 DIAGNOSIS — J111 Influenza due to unidentified influenza virus with other respiratory manifestations: Secondary | ICD-10-CM

## 2014-11-02 MED ORDER — KETOROLAC TROMETHAMINE 30 MG/ML IJ SOLN
30.0000 mg | Freq: Once | INTRAMUSCULAR | Status: AC
  Administered 2014-11-02: 30 mg via INTRAMUSCULAR

## 2014-11-02 MED ORDER — HYDROCOD POLST-CHLORPHEN POLST 10-8 MG/5ML PO LQCR: 5.0000 mL | Freq: Two times a day (BID) | ORAL | Status: DC | PRN

## 2014-11-02 MED ORDER — OSELTAMIVIR PHOSPHATE 75 MG PO CAPS: 75.0000 mg | ORAL_CAPSULE | Freq: Two times a day (BID) | ORAL | Status: DC

## 2014-11-02 MED ORDER — AZITHROMYCIN 250 MG PO TABS: ORAL_TABLET | ORAL | Status: DC

## 2014-11-02 NOTE — Addendum Note (Signed)
Addended by: Monica BectonHEKKEKANDAM, THOMAS J on: 11/02/2014 04:55 PM   Modules accepted: Orders, Medications

## 2014-11-02 NOTE — Addendum Note (Signed)
Addended by: Pixie CasinoUNNINGHAM, RHONDA C on: 11/02/2014 12:21 PM   Modules accepted: Orders

## 2014-11-02 NOTE — Assessment & Plan Note (Addendum)
Decreased breath sounds at the right base, chest x-ray. Also adding Tussionex and Tamiflu. Toradol 30 mg intramuscular.  Discontinue Tamiflu, start azithromycin.

## 2014-11-02 NOTE — Progress Notes (Addendum)
  Subjective:    CC: Sick  HPI: For the past 2 days Eric Lynch has had muscle aches, body aches, fevers to 100, mild cough, no shortness of breath, mild headaches, mild chills. No GI symptoms, no skin rash. Symptoms are severe, persistent. He does desire to establish care.  Past medical history, Surgical history, Family history not pertinant except as noted below, Social history, Allergies, and medications have been entered into the medical record, reviewed, and no changes needed.   Review of Systems: No fevers, chills, night sweats, weight loss, chest pain, or shortness of breath.   Objective:    General: Well Developed, well nourished, and in no acute distress.  Neuro: Alert and oriented x3, extra-ocular muscles intact, sensation grossly intact.  HEENT: Normocephalic, atraumatic, pupils equal round reactive to light, neck supple, no masses, no lymphadenopathy, thyroid nonpalpable. Oropharynx, nasopharynx, ear canals are unremarkable. Skin: Warm and dry, no rashes. Cardiac: Regular rate and rhythm, no murmurs rubs or gallops, no lower extremity edema.  Respiratory: Clear to auscultation bilaterally, slightly decreased sounds in the right base.. Not using accessory muscles, speaking in full sentences.  Chest x-ray shows a right lower lobe pneumonia  Impression and Recommendations:

## 2014-11-02 NOTE — Assessment & Plan Note (Signed)
Patient will establish care. Checking routine blood work including thyroid function. He will return for this fasting after he gets over his illness.

## 2014-11-04 ENCOUNTER — Telehealth: Payer: Self-pay | Admitting: *Deleted

## 2014-11-04 NOTE — Telephone Encounter (Signed)
I returned Eric Lynch's message regarding headache and neck pain that he was experiencing. He said that he was diagnosed with pneumonia at the beginning of the week and was concerned about meningitis. He said that he took the cough medicine late last night and said that it hits him hard but the longer he was up the better he was feeling this morning. He said that he probably slept wrong regarding the headache and neck pain because it also was resolving. I asked about fever, chills, SOB, sensitivity to light and he denied everything and said that he had not been fevered in over 24 hours and said that he was keeping well hydrated. I said that I would send the message on to you regarding his earlier symptoms but since they were resolving I didn't feel that he needed to come in today for an appt and that I would call him back with further advice. Please advise

## 2014-11-04 NOTE — Telephone Encounter (Signed)
Thank you, if he is improving we can keep pre-existing follow-up.

## 2014-11-04 NOTE — Telephone Encounter (Signed)
Spoke with Eric Lynch this afternoon and told him to be aware of fever, chills, headache, etc and if these occurred to go to ER and not wait to come to office if this happens prior to his office follow up. He felt that he was improving and didn't feel any impending backslide.

## 2014-11-09 ENCOUNTER — Ambulatory Visit (INDEPENDENT_AMBULATORY_CARE_PROVIDER_SITE_OTHER): Payer: BLUE CROSS/BLUE SHIELD | Admitting: Sports Medicine

## 2014-11-09 ENCOUNTER — Encounter: Payer: Self-pay | Admitting: Sports Medicine

## 2014-11-09 VITALS — BP 120/72 | HR 82 | Ht 72.0 in | Wt 221.0 lb

## 2014-11-09 DIAGNOSIS — J189 Pneumonia, unspecified organism: Secondary | ICD-10-CM | POA: Diagnosis not present

## 2014-11-09 NOTE — Assessment & Plan Note (Signed)
Was very ill, tachycardic, febrile. Chest x-ray did show a pneumonia.  symptoms have for the most part resolved with azithromycin, over-the-counter cough medicine his affected. He will probably have a postinfectious cough for another 3-4 weeks.  Return as needed.

## 2014-11-09 NOTE — Progress Notes (Signed)
  Subjective:    CC: follow-up  HPI: Eric Lynch returns, he had a right lower lobe pneumonia and treated with azithromycin, overall he is almost 100% better, afebrile, still has a mild nonproductive cough.  Past medical history, Surgical history, Family history not pertinant except as noted below, Social history, Allergies, and medications have been entered into the medical record, reviewed, and no changes needed.   Review of Systems: No fevers, chills, night sweats, weight loss, chest pain, or shortness of breath.   Objective:    General: Well Developed, well nourished, and in no acute distress.  Neuro: Alert and oriented x3, extra-ocular muscles intact, sensation grossly intact.  HEENT: Normocephalic, atraumatic, pupils equal round reactive to light, neck supple, no masses, no lymphadenopathy, thyroid nonpalpable.  Skin: Warm and dry, no rashes. Cardiac: Regular rate and rhythm, no murmurs rubs or gallops, no lower extremity edema.  Respiratory: Clear to auscultation bilaterally. Not using accessory muscles, speaking in full sentences.  Impression and Recommendations:

## 2014-11-23 LAB — CBC
HCT: 44.7 % (ref 39.0–52.0)
Hemoglobin: 15 g/dL (ref 13.0–17.0)
MCH: 29 pg (ref 26.0–34.0)
MCHC: 33.6 g/dL (ref 30.0–36.0)
MCV: 86.3 fL (ref 78.0–100.0)
MPV: 9.3 fL (ref 8.6–12.4)
Platelets: 292 10*3/uL (ref 150–400)
RBC: 5.18 MIL/uL (ref 4.22–5.81)
RDW: 14 % (ref 11.5–15.5)
WBC: 4.9 10*3/uL (ref 4.0–10.5)

## 2014-11-23 LAB — HEMOGLOBIN A1C
Hgb A1c MFr Bld: 5.5 % (ref <5.7)
Mean Plasma Glucose: 111 mg/dL (ref <117)

## 2014-11-23 LAB — COMPREHENSIVE METABOLIC PANEL
ALT: 15 U/L (ref 0–53)
Albumin: 4.1 g/dL (ref 3.5–5.2)
BUN: 12 mg/dL (ref 6–23)
Calcium: 9 mg/dL (ref 8.4–10.5)
Chloride: 107 mEq/L (ref 96–112)
Creat: 0.85 mg/dL (ref 0.50–1.35)
Glucose, Bld: 92 mg/dL (ref 70–99)
Potassium: 4 mEq/L (ref 3.5–5.3)
Sodium: 140 mEq/L (ref 135–145)
Total Bilirubin: 0.9 mg/dL (ref 0.2–1.2)

## 2014-11-23 LAB — LIPID PANEL
Cholesterol: 118 mg/dL (ref 0–200)
HDL: 40 mg/dL (ref >39)
LDL Cholesterol: 65 mg/dL (ref 0–99)
Total CHOL/HDL Ratio: 3 Ratio
Triglycerides: 66 mg/dL (ref <150)
VLDL: 13 mg/dL (ref 0–40)

## 2014-11-23 LAB — TSH: TSH: 0.475 u[IU]/mL (ref 0.350–4.500)

## 2014-11-23 LAB — COMPREHENSIVE METABOLIC PANEL WITH GFR
AST: 13 U/L (ref 0–37)
Alkaline Phosphatase: 51 U/L (ref 39–117)
CO2: 25 meq/L (ref 19–32)
Total Protein: 6.8 g/dL (ref 6.0–8.3)

## 2014-12-20 ENCOUNTER — Encounter: Payer: Self-pay | Admitting: Family Medicine

## 2014-12-20 ENCOUNTER — Ambulatory Visit (INDEPENDENT_AMBULATORY_CARE_PROVIDER_SITE_OTHER): Payer: BLUE CROSS/BLUE SHIELD | Admitting: Family Medicine

## 2014-12-20 VITALS — BP 138/85 | HR 82 | Temp 98.0°F | Wt 220.0 lb

## 2014-12-20 DIAGNOSIS — R351 Nocturia: Secondary | ICD-10-CM

## 2014-12-20 DIAGNOSIS — R350 Frequency of micturition: Secondary | ICD-10-CM | POA: Diagnosis not present

## 2014-12-20 DIAGNOSIS — R300 Dysuria: Secondary | ICD-10-CM

## 2014-12-20 MED ORDER — TAMSULOSIN HCL 0.4 MG PO CAPS: 0.4000 mg | ORAL_CAPSULE | Freq: Every day | ORAL | Status: DC

## 2014-12-20 NOTE — Progress Notes (Signed)
CC: Eric Lynch is a 34 y.o. male is here for Dysuria   Subjective: HPI:  Over the past 2 weeks has noticed that he's been waking up every night at 4 AM with an intense urge to urinate. He feels that he empties his bladder fully just prior to sleep at 11 PM and then again at 4 AM. He denies any genitourinary complaints in the daytime.  Symptoms have not gotten any better since he restricted fluid intake in the evenings. He believes he might have some dysuria is not entirely sure. He denies any penile discharge. Denies any urgency frequency during the daytime. No testicular pain. He tells me that he has just a heightened awareness in his lower pelvis and that there is a feeling that he just can't quite put his finger on that has also been present for 2 weeks. Family history significant for father with bladder cancer.  He denies any unintentional weight loss, fevers, chills, flank pain, diarrhea, constipation, nor sense of feeling unwell other than that described above   Review Of Systems Outlined In HPI  Past Medical History  Diagnosis Date  . Thyroid disease   . Vitamin D deficiency   . Allergy     Past Surgical History  Procedure Laterality Date  . No past surgeries     Family History  Problem Relation Age of Onset  . Hypertension Father   . Diabetes Father   . Heart failure Father   . Cancer Father     BLADDER  . Hyperlipidemia Father   . Alcohol abuse Maternal Uncle   . Stroke Maternal Grandmother   . Alcohol abuse Maternal Grandfather   . Heart attack Maternal Grandmother     History   Social History  . Marital Status: Married    Spouse Name: N/A  . Number of Children: N/A  . Years of Education: N/A   Occupational History  . Not on file.   Social History Main Topics  . Smoking status: Never Smoker   . Smokeless tobacco: Not on file  . Alcohol Use: No  . Drug Use: No  . Sexual Activity: Not on file   Other Topics Concern  . Not on file   Social History  Narrative     Objective: BP 138/85 mmHg  Pulse 82  Temp(Src) 98 F (36.7 C) (Oral)  Wt 220 lb (99.791 kg)  Vital signs reviewed. General: Alert and Oriented, No Acute Distress HEENT: Pupils equal, round, reactive to light. Conjunctivae clear.  External ears unremarkable.  Moist mucous membranes. Lungs: Clear and comfortable work of breathing, speaking in full sentences without accessory muscle use. Cardiac: Regular rate and rhythm.  Neuro: CN II-XII grossly intact, gait normal. Extremities: No peripheral edema.  Strong peripheral pulses.  Mental Status: No depression, anxiety, nor agitation. Logical though process. Skin: Warm and dry.  Assessment & Plan: Eric Lynch was seen today for dysuria.  Diagnoses and all orders for this visit:  Dysuria Orders: -     Urinalysis Dipstick -     Urine Culture -     PSA  Urinary frequency Orders: -     tamsulosin (FLOMAX) 0.4 MG CAPS capsule; Take 1 capsule (0.4 mg total) by mouth daily.  Nocturia Orders: -     PSA   Dysuria with nocturia: Checking PSA to screen for prostate cancer. Urinalysis is entirely normal today. Begin Flomax and if no benefit by Friday call me and based on his PSA the next step will likely  be ciprofloxacin for possibility of bacterial prostatitis  Return if symptoms worsen or fail to improve.

## 2014-12-20 NOTE — Patient Instructions (Signed)
Call me with an update on Friday.

## 2014-12-21 ENCOUNTER — Telehealth: Payer: Self-pay | Admitting: Family Medicine

## 2014-12-21 LAB — POCT URINALYSIS DIPSTICK
Bilirubin, UA: NEGATIVE
Glucose, UA: NEGATIVE
Ketones, UA: NEGATIVE
Leukocytes, UA: NEGATIVE
Nitrite, UA: NEGATIVE
PROTEIN UA: NEGATIVE
RBC UA: NEGATIVE
Spec Grav, UA: 1.02
UROBILINOGEN UA: 0.2
pH, UA: 7.5

## 2014-12-21 LAB — PSA: PSA: 0.8 ng/mL (ref <=4.00)

## 2014-12-21 MED ORDER — CIPROFLOXACIN HCL 500 MG PO TABS: 500.0000 mg | ORAL_TABLET | Freq: Two times a day (BID) | ORAL | Status: DC

## 2014-12-21 NOTE — Telephone Encounter (Signed)
Pt.notified

## 2014-12-21 NOTE — Telephone Encounter (Signed)
Eric Lynch, In regards to result note, Rx of cipro sent to [facility].  I'd recommend he schedule f/u with Dr. Karie Schwalbe next week.

## 2014-12-31 ENCOUNTER — Encounter: Payer: Self-pay | Admitting: Sports Medicine

## 2014-12-31 ENCOUNTER — Ambulatory Visit (INDEPENDENT_AMBULATORY_CARE_PROVIDER_SITE_OTHER): Payer: BLUE CROSS/BLUE SHIELD | Admitting: Sports Medicine

## 2014-12-31 ENCOUNTER — Ambulatory Visit (INDEPENDENT_AMBULATORY_CARE_PROVIDER_SITE_OTHER): Payer: BLUE CROSS/BLUE SHIELD

## 2014-12-31 ENCOUNTER — Telehealth: Payer: Self-pay

## 2014-12-31 VITALS — BP 121/72 | HR 74 | Temp 97.7°F | Wt 217.0 lb

## 2014-12-31 DIAGNOSIS — J189 Pneumonia, unspecified organism: Secondary | ICD-10-CM

## 2014-12-31 DIAGNOSIS — R300 Dysuria: Secondary | ICD-10-CM | POA: Diagnosis not present

## 2014-12-31 DIAGNOSIS — R351 Nocturia: Secondary | ICD-10-CM

## 2014-12-31 DIAGNOSIS — F419 Anxiety disorder, unspecified: Secondary | ICD-10-CM | POA: Diagnosis not present

## 2014-12-31 LAB — POCT URINALYSIS DIPSTICK
Bilirubin, UA: NEGATIVE
Blood, UA: NEGATIVE
Glucose, UA: NEGATIVE
Ketones, UA: NEGATIVE
Leukocytes, UA: NEGATIVE
Nitrite, UA: NEGATIVE
Spec Grav, UA: >=1.03
Urobilinogen, UA: 0.2
pH, UA: 7

## 2014-12-31 MED ORDER — DOXYCYCLINE HYCLATE 100 MG PO TABS: 100.0000 mg | ORAL_TABLET | Freq: Two times a day (BID) | ORAL | Status: DC

## 2014-12-31 MED ORDER — DOXYCYCLINE HYCLATE 100 MG PO TABS: 100.0000 mg | ORAL_TABLET | Freq: Two times a day (BID) | ORAL | Status: AC

## 2014-12-31 MED ORDER — DEXAMETHASONE 4 MG PO TABS: 4.0000 mg | ORAL_TABLET | Freq: Two times a day (BID) | ORAL | Status: DC

## 2014-12-31 NOTE — Assessment & Plan Note (Signed)
Single episode at night, improving with ciprofloxacin. Prostate exam today does show mild enlargement, his father did have a history of an enlarged prostate as well. He is somewhat apprehensive about starting Flomax. PSA was normal. We will continue antibiotics until he is finished, and he can return in one month, if persistent symptoms we will start Flomax. If still has persistent symptoms for a month after that we will refer to urology, and likely obtain a transrectal ultrasound.

## 2014-12-31 NOTE — Assessment & Plan Note (Signed)
Still with a mild wheeze and cough after episode of community-acquired pneumonia. Switching to doxycycline and adding Decadron. Return in a month, if symptoms have resolved we will do pre-and postbronchodilator PFTs.

## 2014-12-31 NOTE — Assessment & Plan Note (Signed)
Minimal and does not need treatment. PHQ9 and GAD7 scores are both 1

## 2014-12-31 NOTE — Telephone Encounter (Signed)
Patient spouse called stated that patient was very upset after his OV today. She stated that patient looked up the Flomax and he refuse to pick it up from the pharmacy and take, he wanted to know why he was added on another antibiotic and I explained that patient had blood in his urine, and it was sent off to culture.Patient spouse wanted to know if there was a steroid inhaler that could be called in for patient please advise [facility]. Rhonda Cunningham,CMA

## 2014-12-31 NOTE — Progress Notes (Signed)
  Subjective:    CC: Follow-up  HPI: Nocturia: Recently saw Dr. Kary KosHummel, was placed on ciprofloxacin appropriately, symptoms have improved somewhat but he still wakes up early in the morning to void. He denies any urgency during the day, denies any pain, or constitutional symptoms. He has been somewhat hesitant to start Flomax. He does have family history of prostatic enlargement, and his father has had major pelvic surgery for bladder cancer. PSA recently was normal, and a recent urinalysis showed only a small amount of protein.  History of community-acquired pneumonia: Still wheezing and coughing.  Anxiety: Feels overall uneasy and restless, has difficulty with his sleeping, and only mild anxiety and nervousness, no suicidal or homicidal ideation.  Past medical history, Surgical history, Family history not pertinant except as noted below, Social history, Allergies, and medications have been entered into the medical record, reviewed, and no changes needed.   Review of Systems: No fevers, chills, night sweats, weight loss, chest pain, or shortness of breath.   Objective:    General: Well Developed, well nourished, and in no acute distress.  Neuro: Alert and oriented x3, extra-ocular muscles intact, sensation grossly intact.  HEENT: Normocephalic, atraumatic, pupils equal round reactive to light, neck supple, no masses, no lymphadenopathy, thyroid nonpalpable.  Skin: Warm and dry, no rashes. Cardiac: Regular rate and rhythm, no murmurs rubs or gallops, no lower extremity edema.  Respiratory: Clear to auscultation bilaterally. Not using accessory muscles, speaking in full sentences. Abdomen: Soft, nontender, nondistended, normal bowel sounds, no palpable masses. Rectal: Good tone, there is a small external hemorrhoid that is nontender at approximately 9:00, prostate is smooth but mildly enlarged. Nontender.  Impression and Recommendations:    I spent 40 minutes with this patient, greater  than 50% was face-to-face time counseling regarding the above diagnoses.

## 2015-01-02 LAB — URINE CULTURE
Colony Count: NO GROWTH
Organism ID, Bacteria: NO GROWTH

## 2015-01-07 ENCOUNTER — Telehealth: Payer: Self-pay

## 2015-01-07 NOTE — Telephone Encounter (Signed)
Flomax was called in because of what sounded to be obstructive uropathy, we also added another antibiotic due to persistent, but mild diffuse wheezing. The other antibiotic is more specific for urinary bugs, and was for a suspicion of prostatitis. I can call in Symbicort, he can also come in and he grab a sample of the 160/4.5 mg dose for use twice a day.  That might be better because its free.

## 2015-01-07 NOTE — Telephone Encounter (Signed)
Patient wife called again stated that   patient was very upset after his OV today. She stated that patient looked up the Flomax and he refuse to pick it up from the pharmacy and take, he wanted to know why he was added on another antibiotic and I explained that patient had blood in his urine, and it was sent off to culture.Patient spouse wanted to know if there was a steroid inhaler that could be called in for patient please advise [facility].  Again patient is requesting a steroid inhaler for use.Kimberly Coye,CMA

## 2015-01-10 ENCOUNTER — Telehealth: Payer: Self-pay

## 2015-01-10 MED ORDER — BUDESONIDE-FORMOTEROL FUMARATE 160-4.5 MCG/ACT IN AERO: 2.0000 | INHALATION_SPRAY | Freq: Two times a day (BID) | RESPIRATORY_TRACT | Status: DC

## 2015-01-10 NOTE — Telephone Encounter (Signed)
Sample of Symbicort 160/4.5 was given to patient today. Rhonda Cunningham,CMA

## 2015-01-10 NOTE — Telephone Encounter (Signed)
PATIENT WIFE HAS BEEN INFORMED. Linell Shawn,CMA

## 2015-02-09 ENCOUNTER — Encounter: Payer: Self-pay | Admitting: Internal Medicine

## 2015-02-17 ENCOUNTER — Telehealth: Payer: Self-pay

## 2015-02-17 MED ORDER — LEVOTHYROXINE SODIUM 100 MCG PO TABS: 100.0000 ug | ORAL_TABLET | Freq: Every day | ORAL | Status: DC

## 2015-02-17 MED ORDER — ACYCLOVIR 800 MG PO TABS: 800.0000 mg | ORAL_TABLET | Freq: Two times a day (BID) | ORAL | Status: DC

## 2015-02-17 NOTE — Telephone Encounter (Signed)
Patient request refill for Levothyroxine 100 mg #90  3 [facility]. Rhonda Cunningham,CMA

## 2015-02-17 NOTE — Telephone Encounter (Signed)
Patient requested refill for Levothyroxine and Acyclovir 800 mg  [facility]. Glady Ouderkirk,CMA

## 2015-04-20 IMAGING — CR DG CHEST 2V
2 series · 2 of 2 positions shown · non-contrast
Comparison: 11/02/2014.

CLINICAL DATA: Pneumonia.

EXAM:
CHEST  2 VIEW

[chest pa]
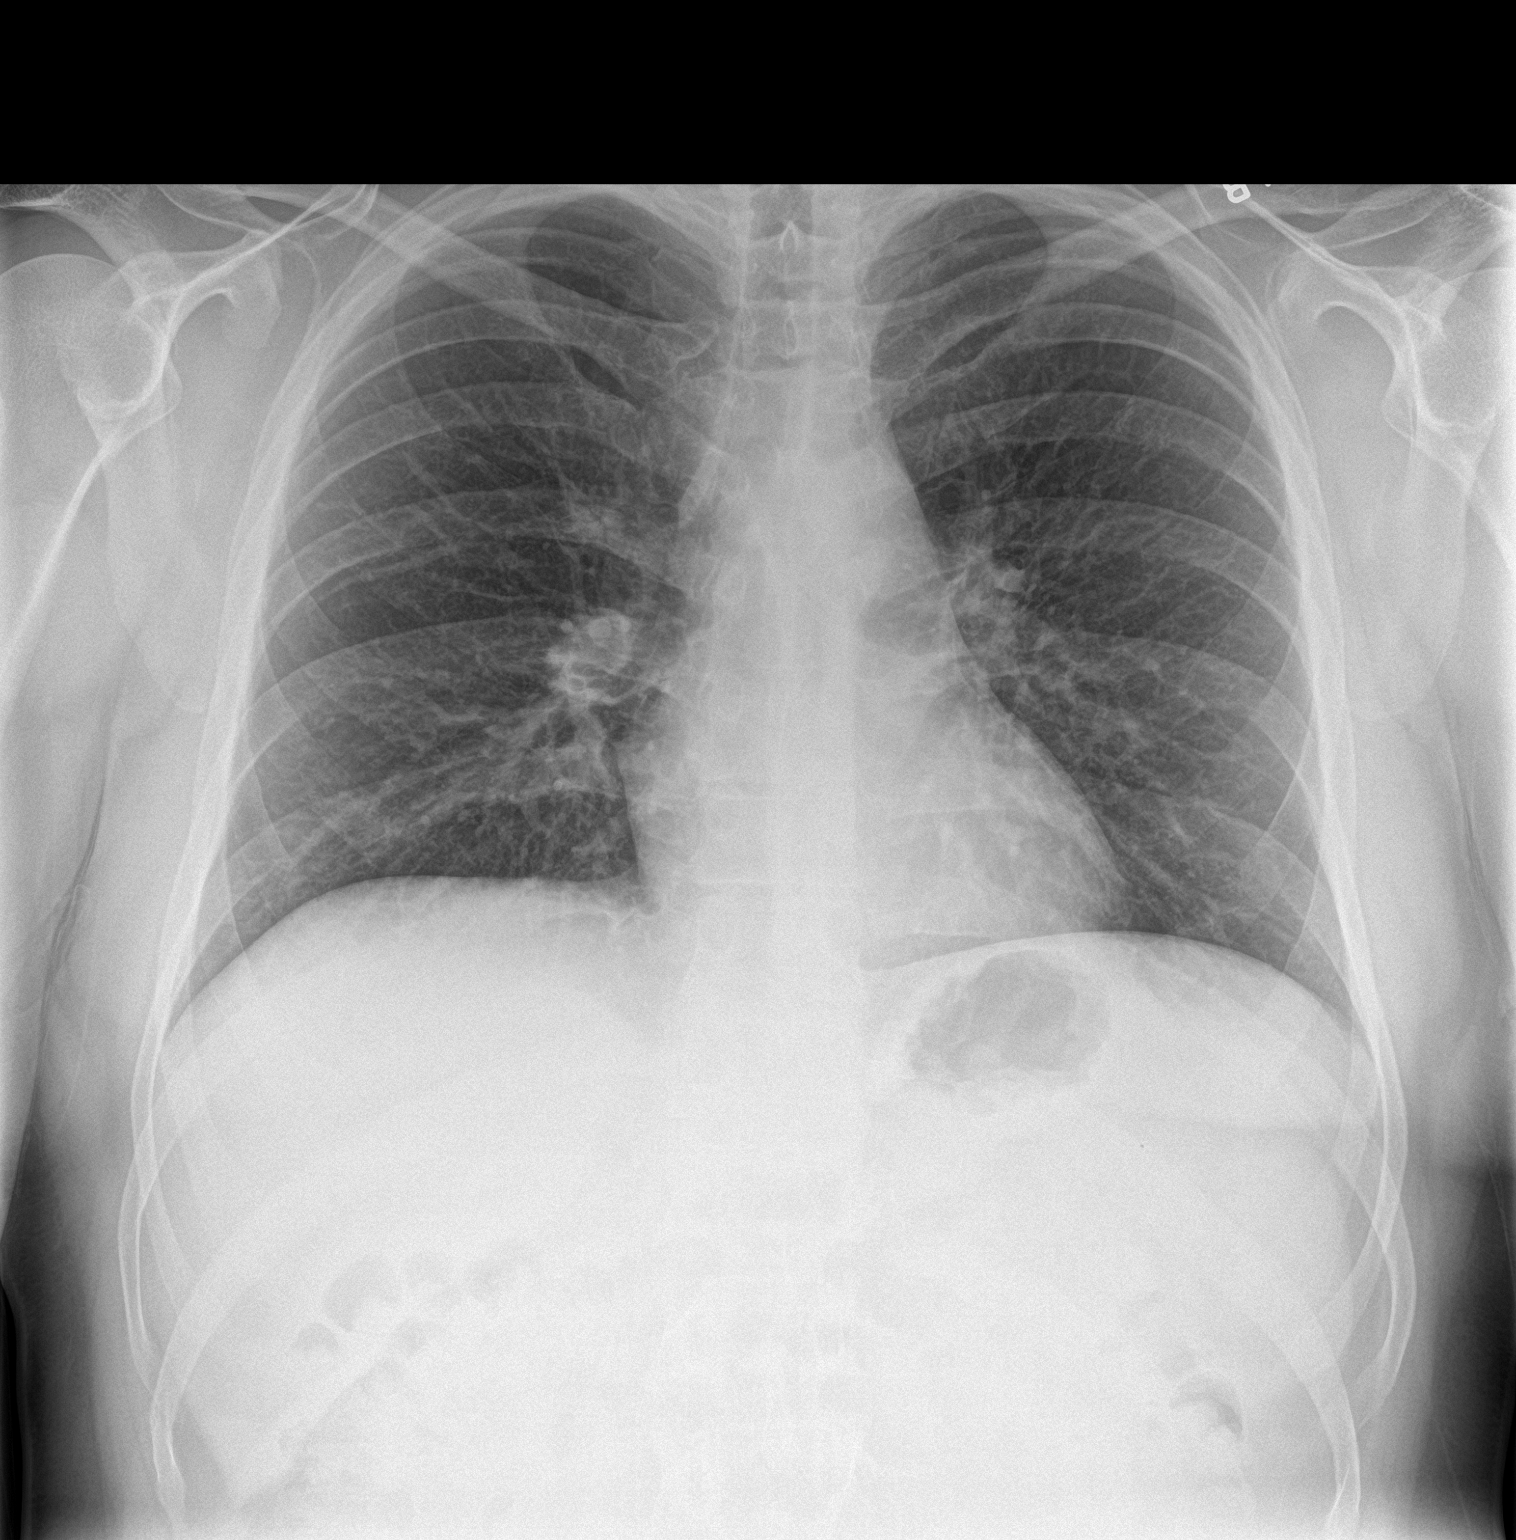

[chest lat]
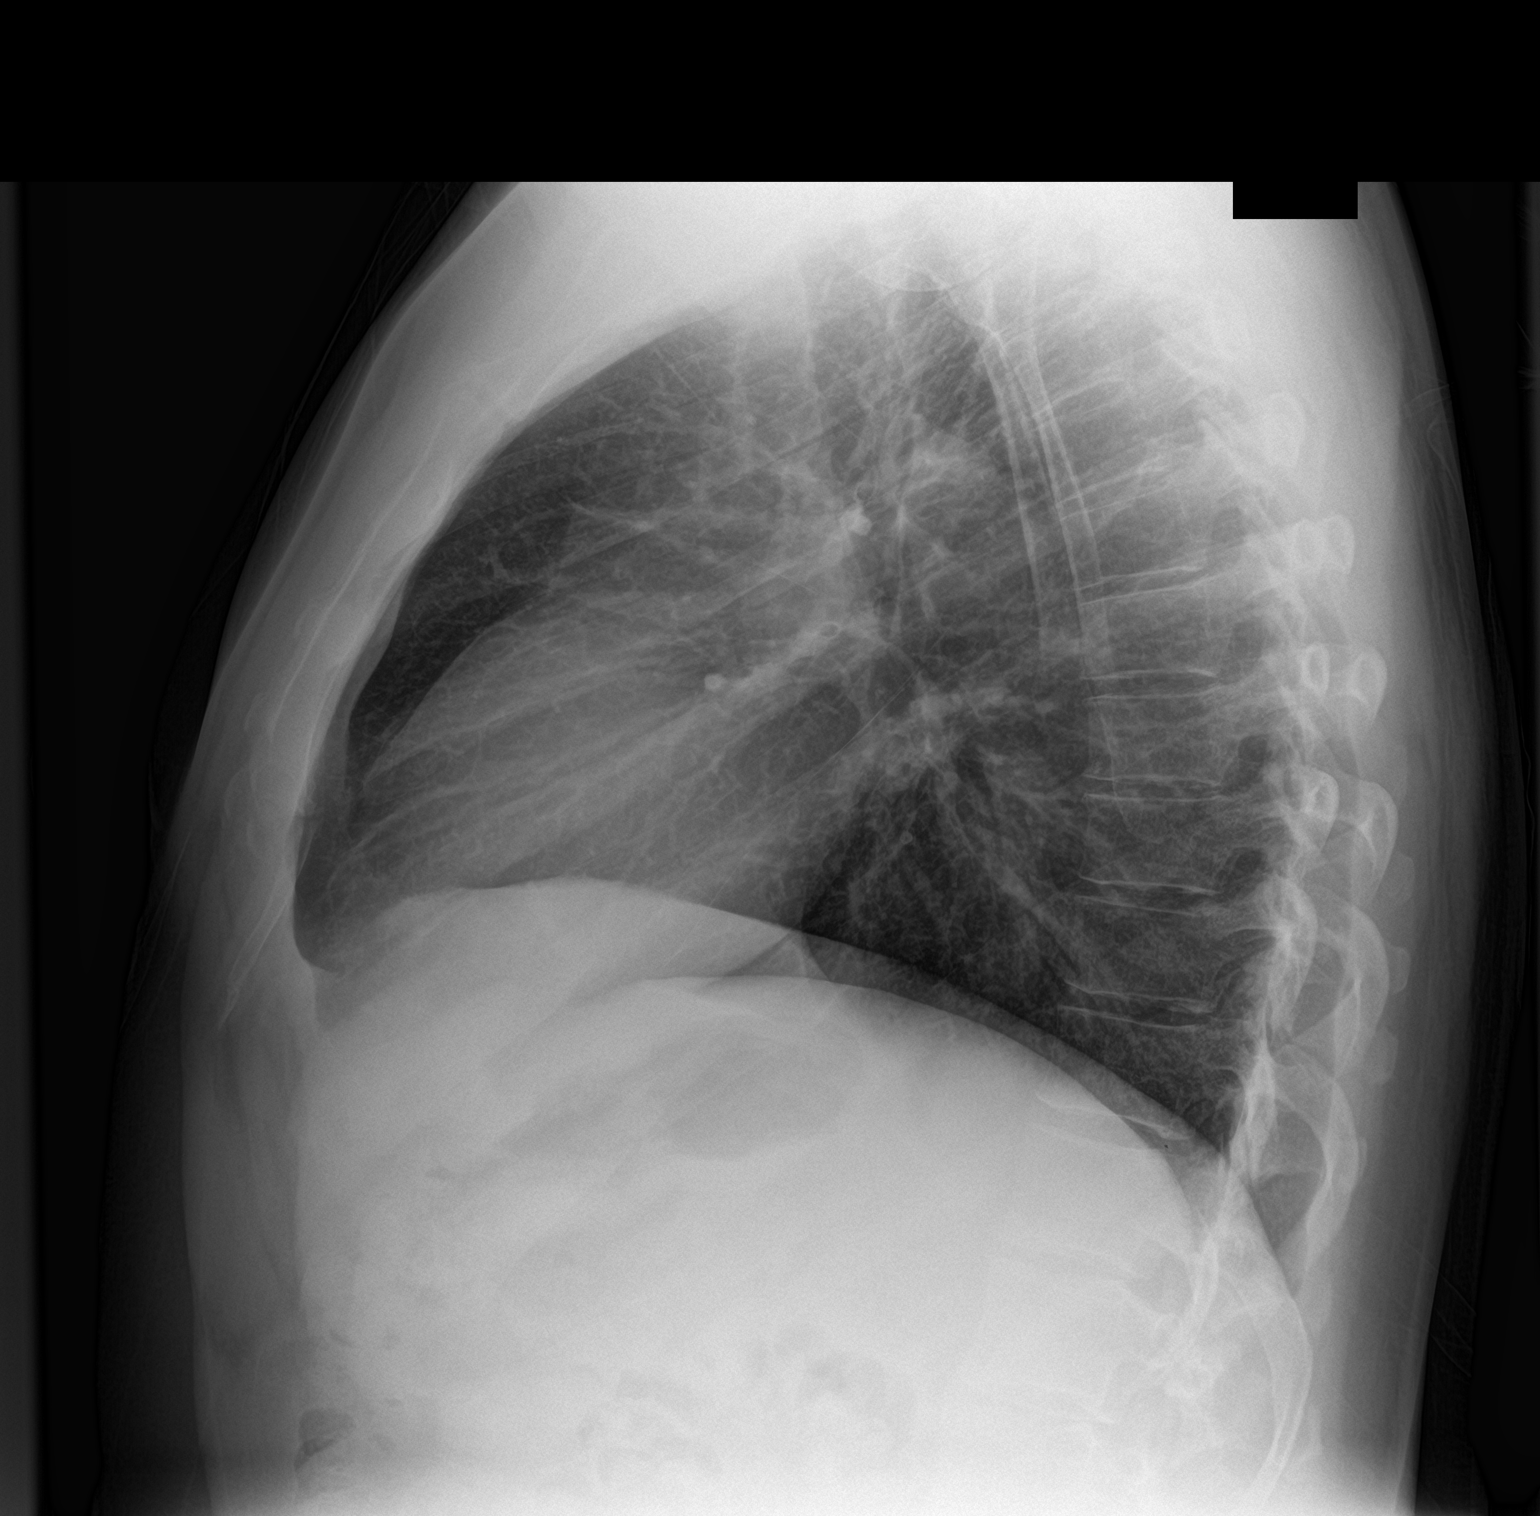

[2 of 2 positions shown; findings below may reference images not displayed]

FINDINGS: Mediastinum and hilar structures normal. Lungs are clear. Previously
identified right lower lobe infiltrate is clear. Heart size normal.
No pleural effusion or pneumothorax.
IMPRESSION: No acute cardiopulmonary disease. Previously identified right lower
lobe infiltrate has cleared.

## 2015-11-03 ENCOUNTER — Ambulatory Visit (INDEPENDENT_AMBULATORY_CARE_PROVIDER_SITE_OTHER): Payer: BLUE CROSS/BLUE SHIELD | Admitting: Sports Medicine

## 2015-11-03 ENCOUNTER — Encounter: Payer: Self-pay | Admitting: Sports Medicine

## 2015-11-03 ENCOUNTER — Ambulatory Visit (INDEPENDENT_AMBULATORY_CARE_PROVIDER_SITE_OTHER): Payer: BLUE CROSS/BLUE SHIELD

## 2015-11-03 DIAGNOSIS — J189 Pneumonia, unspecified organism: Secondary | ICD-10-CM

## 2015-11-03 DIAGNOSIS — Z8701 Personal history of pneumonia (recurrent): Secondary | ICD-10-CM

## 2015-11-03 DIAGNOSIS — R05 Cough: Secondary | ICD-10-CM

## 2015-11-03 MED FILL — ACYCLOVIR 800 MG TABLET: 800 | 30 days supply | Qty: 60 | Fill #5

## 2015-11-03 NOTE — Assessment & Plan Note (Signed)
This has been resolved for one year, now having recurrence of cough, muscle aches, body aches, this likely represents a viral upper respiratory infection however we are going to obtain a chest x-ray. I did recommend over-the-counter cold and flu medications.

## 2015-11-03 NOTE — Progress Notes (Signed)
  Subjective:    CC: Cough  HPI: This is a pleasant 35 year old male with a four-day history of minimally productive cough, he does a history of community-acquired pneumonia 1 year ago. Mild muscle aches and body aches, minimal sore throat, no fevers, chills, GI symptoms, rash. No shortness of breath. No chest pain.  Past medical history, Surgical history, Family history not pertinant except as noted below, Social history, Allergies, and medications have been entered into the medical record, reviewed, and no changes needed.   Review of Systems: No fevers, chills, night sweats, weight loss, chest pain, or shortness of breath.   Objective:    General: Well Developed, well nourished, and in no acute distress.  Neuro: Alert and oriented x3, extra-ocular muscles intact, sensation grossly intact.  HEENT: Normocephalic, atraumatic, pupils equal round reactive to light, neck supple, no masses, no lymphadenopathy, thyroid nonpalpable. Oropharynx, nasopharynx, ear canals are unremarkable. Skin: Warm and dry, no rashes. Cardiac: Regular rate and rhythm, no murmurs rubs or gallops, no lower extremity edema.  Respiratory: Clear to auscultation bilaterally. Not using accessory muscles, speaking in full sentences.  Impression and Recommendations:

## 2015-11-03 NOTE — Patient Instructions (Signed)
Upper Respiratory Infection, Adult Most upper respiratory infections (URIs) are a viral infection of the air passages leading to the lungs. A URI affects the nose, throat, and upper air passages. The most common type of URI is nasopharyngitis and is typically referred to as "the common cold." URIs run their course and usually go away on their own. Most of the time, a URI does not require medical attention, but sometimes a bacterial infection in the upper airways can follow a viral infection. This is called a secondary infection. Sinus and middle ear infections are common types of secondary upper respiratory infections. Bacterial pneumonia can also complicate a URI. A URI can worsen asthma and chronic obstructive pulmonary disease (COPD). Sometimes, these complications can require emergency medical care and may be life threatening.  CAUSES Almost all URIs are caused by viruses. A virus is a type of germ and can spread from one person to another.  RISKS FACTORS You may be at risk for a URI if:   You smoke.   You have chronic heart or lung disease.  You have a weakened defense (immune) system.   You are very young or very old.   You have nasal allergies or asthma.  You work in crowded or poorly ventilated areas.  You work in health care facilities or schools. SIGNS AND SYMPTOMS  Symptoms typically develop 2-3 days after you come in contact with a cold virus. Most viral URIs last 7-10 days. However, viral URIs from the influenza virus (flu virus) can last 14-18 days and are typically more severe. Symptoms may include:   Runny or stuffy (congested) nose.   Sneezing.   Cough.   Sore throat.   Headache.   Fatigue.   Fever.   Loss of appetite.   Pain in your forehead, behind your eyes, and over your cheekbones (sinus pain).  Muscle aches.  DIAGNOSIS  Your health care provider may diagnose a URI by:  Physical exam.  Tests to check that your symptoms are not due to  another condition such as:  Strep throat.  Sinusitis.  Pneumonia.  Asthma. TREATMENT  A URI goes away on its own with time. It cannot be cured with medicines, but medicines may be prescribed or recommended to relieve symptoms. Medicines may help:  Reduce your fever.  Reduce your cough.  Relieve nasal congestion. HOME CARE INSTRUCTIONS   Take medicines only as directed by your health care provider.   Gargle warm saltwater or take cough drops to comfort your throat as directed by your health care provider.  Use a warm mist humidifier or inhale steam from a shower to increase air moisture. This may make it easier to breathe.  Drink enough fluid to keep your urine clear or pale yellow.   Eat soups and other clear broths and maintain good nutrition.   Rest as needed.   Return to work when your temperature has returned to normal or as your health care provider advises. You may need to stay home longer to avoid infecting others. You can also use a face mask and careful hand washing to prevent spread of the virus.  Increase the usage of your inhaler if you have asthma.   Do not use any tobacco products, including cigarettes, chewing tobacco, or electronic cigarettes. If you need help quitting, ask your health care provider. PREVENTION  The best way to protect yourself from getting a cold is to practice good hygiene.   Avoid oral or hand contact with people with cold   symptoms.   Wash your hands often if contact occurs.  There is no clear evidence that vitamin C, vitamin E, echinacea, or exercise reduces the chance of developing a cold. However, it is always recommended to get plenty of rest, exercise, and practice good nutrition.  SEEK MEDICAL CARE IF:   You are getting worse rather than better.   Your symptoms are not controlled by medicine.   You have chills.  You have worsening shortness of breath.  You have brown or red mucus.  You have yellow or brown nasal  discharge.  You have pain in your face, especially when you bend forward.  You have a fever.  You have swollen neck glands.  You have pain while swallowing.  You have white areas in the back of your throat. SEEK IMMEDIATE MEDICAL CARE IF:   You have severe or persistent:  Headache.  Ear pain.  Sinus pain.  Chest pain.  You have chronic lung disease and any of the following:  Wheezing.  Prolonged cough.  Coughing up blood.  A change in your usual mucus.  You have a stiff neck.  You have changes in your:  Vision.  Hearing.  Thinking.  Mood. MAKE SURE YOU:   Understand these instructions.  Will watch your condition.  Will get help right away if you are not doing well or get worse.   This information is not intended to replace advice given to you by your health care provider. Make sure you discuss any questions you have with your health care provider.   Document Released: 04/03/2001 Document Revised: 02/22/2015 Document Reviewed: 01/13/2014 Elsevier Interactive Patient Education 2016 [employer]  

## 2015-11-16 ENCOUNTER — Telehealth: Payer: Self-pay

## 2015-11-16 DIAGNOSIS — J189 Pneumonia, unspecified organism: Secondary | ICD-10-CM

## 2015-11-16 NOTE — Telephone Encounter (Signed)
Pt left VM stating has continued congestion after seeing you 11/03/15. Would like to know what you recommend at this point? Please advise.

## 2015-11-17 ENCOUNTER — Ambulatory Visit (INDEPENDENT_AMBULATORY_CARE_PROVIDER_SITE_OTHER): Payer: BLUE CROSS/BLUE SHIELD

## 2015-11-17 DIAGNOSIS — J189 Pneumonia, unspecified organism: Secondary | ICD-10-CM

## 2015-11-17 DIAGNOSIS — R0989 Other specified symptoms and signs involving the circulatory and respiratory systems: Secondary | ICD-10-CM

## 2015-11-17 MED ORDER — FLUTICASONE PROPIONATE 50 MCG/ACT NA SUSP: NASAL | Status: DC

## 2015-11-17 MED FILL — LEVOTHYROXINE 100 MCG TAB: 100 | 90 days supply | Qty: 90 | Fill #3

## 2015-11-17 NOTE — Telephone Encounter (Signed)
Wife of patient informed of the flonase and information. States pt is having "lower chest congestion" and wondering if the flonase should still be used.

## 2015-11-17 NOTE — Telephone Encounter (Signed)
Spoke with pt and informed of x-ray and flonase. Pt will have x-ray completed today.

## 2015-11-17 NOTE — Telephone Encounter (Signed)
There is probably an element of postnasal drip, chest x-ray was negative suggesting no pneumonia, adding Flonase. If increasing shortness of breath, fevers, chills, malaise, then we should repeat a chest x-ray.

## 2015-11-17 NOTE — Telephone Encounter (Signed)
Let us go ahead and get a repeat chest x-ray, orders placed, yes he should use the Flonase because the symptom is typically secondary to postnasal drip, also cough?  SOB?  Fevers/chills?

## 2015-11-25 ENCOUNTER — Encounter: Payer: Self-pay | Admitting: Sports Medicine

## 2015-11-25 ENCOUNTER — Ambulatory Visit (INDEPENDENT_AMBULATORY_CARE_PROVIDER_SITE_OTHER): Payer: BLUE CROSS/BLUE SHIELD | Admitting: Sports Medicine

## 2015-11-25 VITALS — BP 123/82 | HR 84 | Temp 98.2°F | Resp 18 | Wt 226.4 lb

## 2015-11-25 DIAGNOSIS — J0100 Acute maxillary sinusitis, unspecified: Secondary | ICD-10-CM | POA: Diagnosis not present

## 2015-11-25 MED ORDER — BECLOMETHASONE DIPROPIONATE 80 MCG/ACT NA AERS: 2.0000 | INHALATION_SPRAY | Freq: Every day | NASAL | Status: DC

## 2015-11-25 MED ORDER — AZITHROMYCIN 250 MG PO TABS: ORAL_TABLET | ORAL | Status: DC

## 2015-11-25 NOTE — Progress Notes (Signed)
  Subjective:    CC: sinus symptoms  HPI: Eric Lynch returns, his cough and lower respiratory symptoms have all resolved with simply the tincture of time and over-the-counter cough and cold medications. Unfortunately he continues to have some pain and pressure behind his right maxillary sinus with radiation to the right jaw. Also has a sensation of stuffiness of the back of his nose. Symptoms are moderate, persistent.  Past medical history, Surgical history, Family history not pertinant except as noted below, Social history, Allergies, and medications have been entered into the medical record, reviewed, and no changes needed.   Review of Systems: No fevers, chills, night sweats, weight loss, chest pain, or shortness of breath.   Objective:    General: Well Developed, well nourished, and in no acute distress.  Neuro: Alert and oriented x3, extra-ocular muscles intact, sensation grossly intact.  HEENT: Normocephalic, atraumatic, pupils equal round reactive to light, neck supple, no masses, no lymphadenopathy, thyroid nonpalpable. Oropharynx, ear canals unremarkable, nasopharynx shows boggy and erythematous turbinates, tender to palpation over the maxillary sinuses. Skin: Warm and dry, no rashes. Cardiac: Regular rate and rhythm, no murmurs rubs or gallops, no lower extremity edema.  Respiratory: Clear to auscultation bilaterally. Not using accessory muscles, speaking in full sentences.  Impression and Recommendations:

## 2015-11-25 NOTE — Assessment & Plan Note (Signed)
Unremarkable lung exam, azithromycin, Qnasl, discontinue Flonase, has failed Flonase, Nasonex.

## 2015-12-16 MED FILL — ACYCLOVIR 800 MG TABLET: 800 | 30 days supply | Qty: 60 | Fill #6

## 2016-02-09 ENCOUNTER — Other Ambulatory Visit: Payer: Self-pay | Admitting: Sports Medicine

## 2016-02-09 MED FILL — ACYCLOVIR 800 MG TABLET: 800 | 30 days supply | Qty: 60 | Fill #0

## 2016-02-09 MED FILL — LEVOTHYROXINE 100 MCG TAB: 100 | 30 days supply | Qty: 30 | Fill #0

## 2016-03-06 IMAGING — CR DG CHEST 2V
2 series · 2 of 2 positions shown · non-contrast
Comparison: November 03, 2015.

CLINICAL DATA: Chest congestion.

EXAM:
CHEST  2 VIEW

[chest pa]
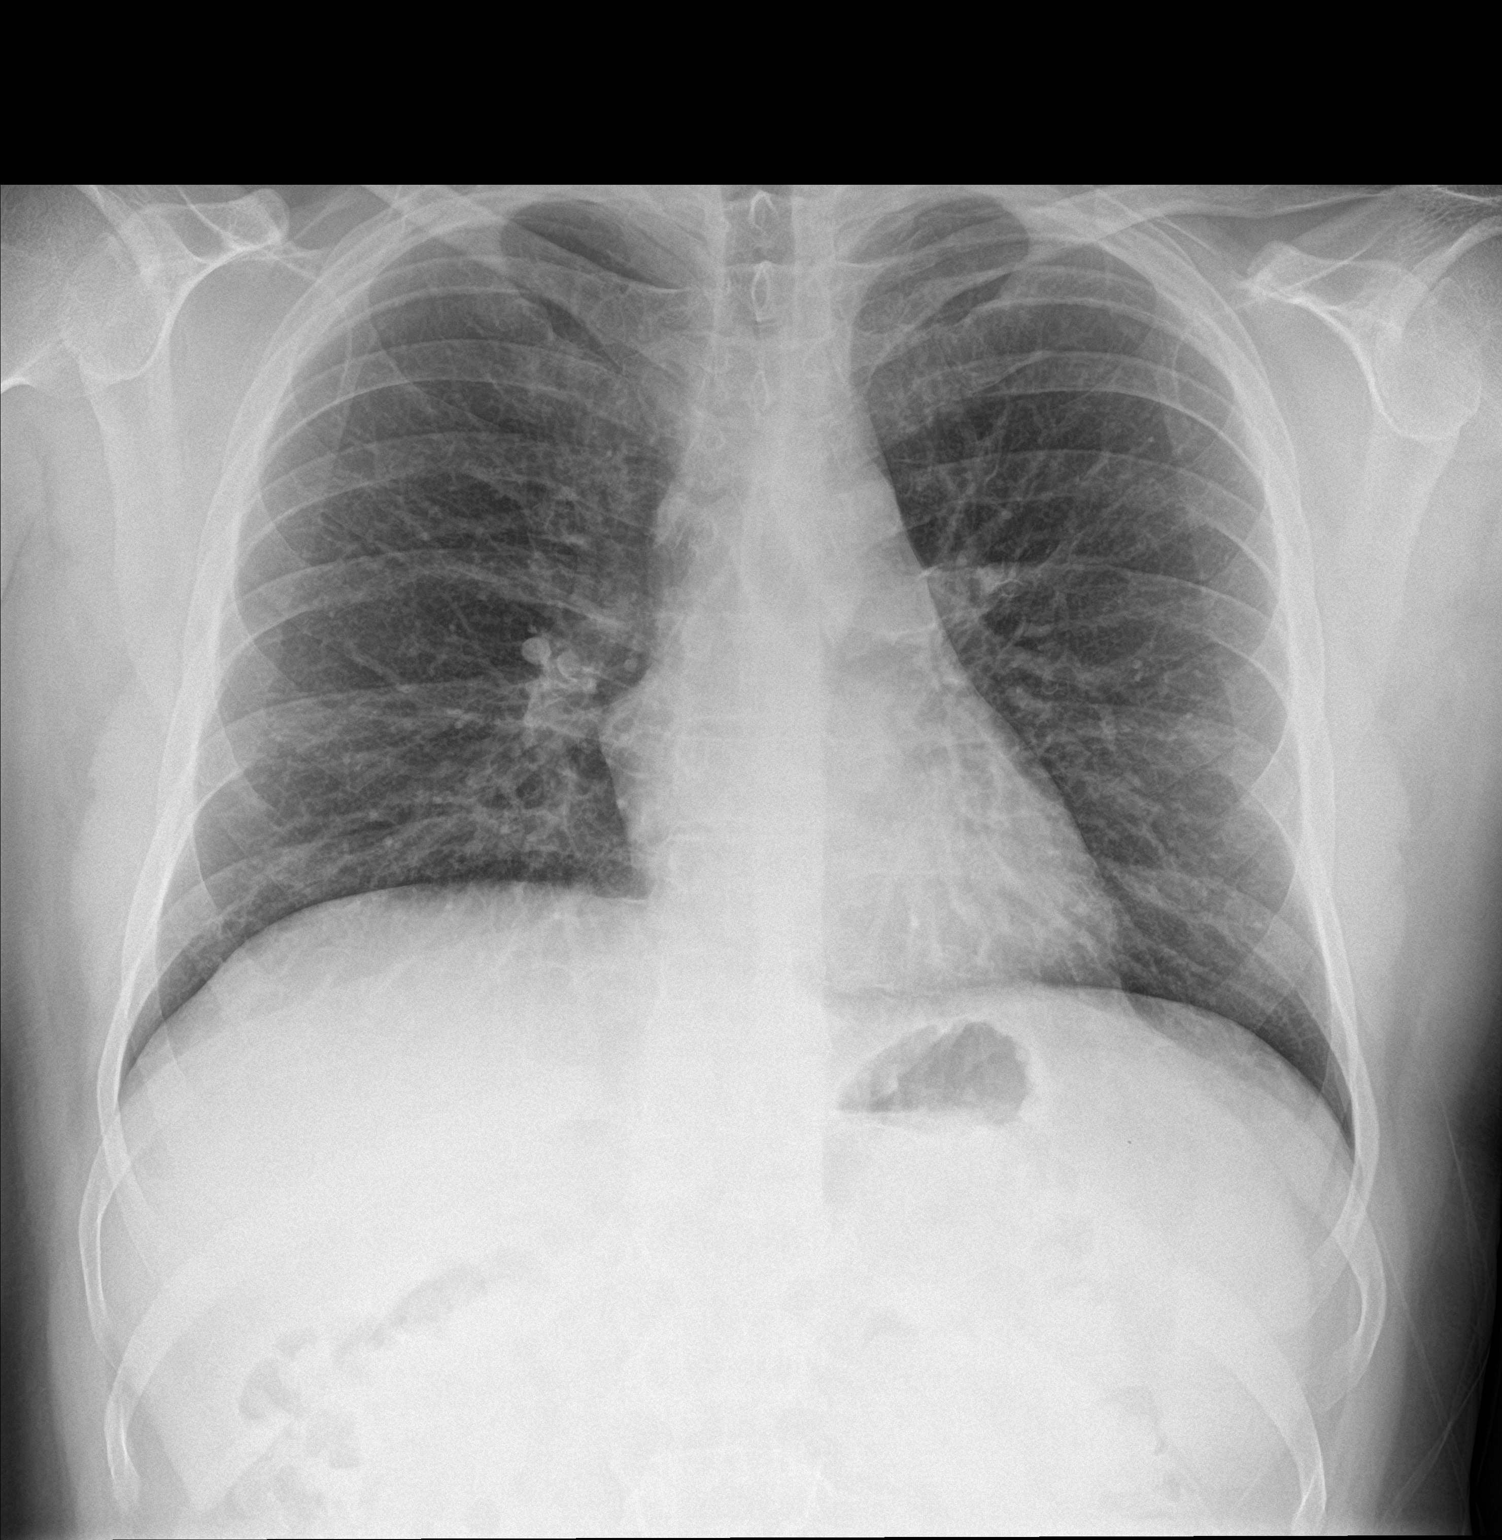

[chest lat]
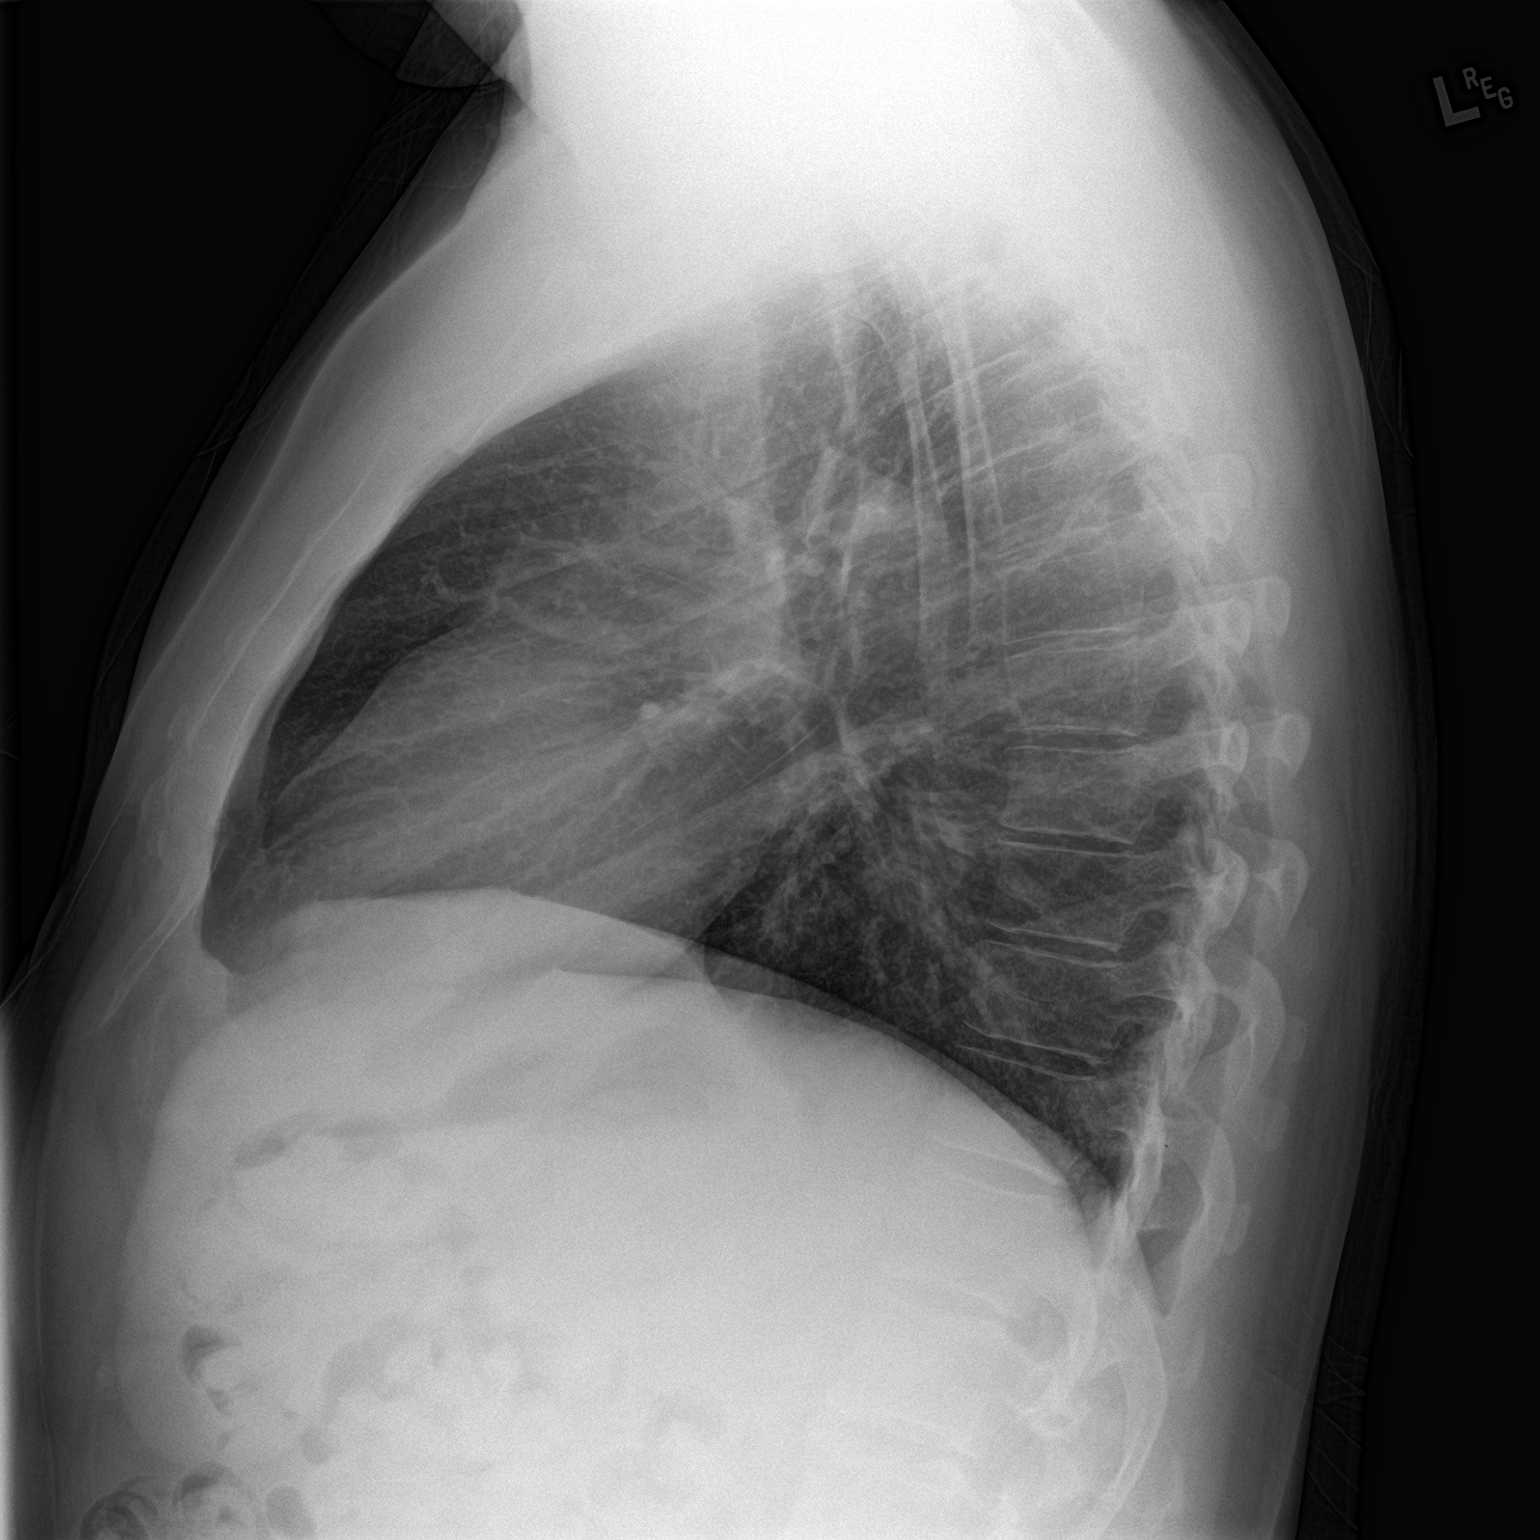

[2 of 2 positions shown; findings below may reference images not displayed]

FINDINGS: The heart size and mediastinal contours are within normal limits.
Both lungs are clear. No pneumothorax or pleural effusion is noted.
The visualized skeletal structures are unremarkable.
IMPRESSION: No active cardiopulmonary disease.

## 2016-03-14 ENCOUNTER — Other Ambulatory Visit: Payer: Self-pay | Admitting: Sports Medicine

## 2016-03-14 MED FILL — LEVOTHYROXINE 100 MCG TAB: 100 | 30 days supply | Qty: 30 | Fill #0

## 2016-03-26 MED FILL — ACYCLOVIR 800 MG TABLET: 800 | 30 days supply | Qty: 60 | Fill #1

## 2016-04-12 MED FILL — LEVOTHYROXINE 100 MCG TAB: 100 | 30 days supply | Qty: 30 | Fill #1

## 2016-05-07 ENCOUNTER — Encounter: Payer: Self-pay | Admitting: Family Medicine

## 2016-05-07 ENCOUNTER — Ambulatory Visit (INDEPENDENT_AMBULATORY_CARE_PROVIDER_SITE_OTHER): Payer: BLUE CROSS/BLUE SHIELD | Admitting: Family Medicine

## 2016-05-07 VITALS — BP 145/90 | HR 84 | Wt 224.0 lb

## 2016-05-07 DIAGNOSIS — W3189XA Contact with other specified machinery, initial encounter: Secondary | ICD-10-CM | POA: Diagnosis not present

## 2016-05-07 DIAGNOSIS — S81011A Laceration without foreign body, right knee, initial encounter: Secondary | ICD-10-CM | POA: Diagnosis not present

## 2016-05-07 DIAGNOSIS — R300 Dysuria: Secondary | ICD-10-CM | POA: Diagnosis not present

## 2016-05-07 DIAGNOSIS — Z23 Encounter for immunization: Secondary | ICD-10-CM | POA: Diagnosis not present

## 2016-05-07 DIAGNOSIS — Z888 Allergy status to other drugs, medicaments and biological substances status: Secondary | ICD-10-CM | POA: Diagnosis not present

## 2016-05-07 MED ORDER — FLUCONAZOLE 150 MG PO TABS: ORAL_TABLET | ORAL | Status: AC

## 2016-05-07 MED FILL — FLUCONAZOLE 150 MG TABLET: 150 | 3 days supply | Qty: 2 | Fill #0

## 2016-05-07 NOTE — Progress Notes (Signed)
CC: Eric Lynch is a 35 y.o. male is here for Penile Discharge   Subjective: HPI:  Over the weekend he began to get a sensation of itching in his urethra when urinating. He's had this before and tells me it feels like a past yeast infection. He denies any visual changes to the penis or any blood in his urine. He has a clear thick discharge that is mild in severity that comes from the head of the penis but no pus. He states he feels great other than the above. He denies any urinary urgency hesitancy or frequency. Denies any fevers or chills   Review Of Systems Outlined In HPI  Past Medical History  Diagnosis Date  . Thyroid disease   . Vitamin D deficiency   . Allergy     Past Surgical History  Procedure Laterality Date  . No past surgeries     Family History  Problem Relation Age of Onset  . Hypertension Father   . Diabetes Father   . Heart failure Father   . Cancer Father     BLADDER  . Hyperlipidemia Father   . Alcohol abuse Maternal Uncle   . Stroke Maternal Grandmother   . Alcohol abuse Maternal Grandfather   . Heart attack Maternal Grandmother     Social History   Social History  . Marital Status: Married    Spouse Name: N/A  . Number of Children: N/A  . Years of Education: N/A   Occupational History  . Not on file.   Social History Main Topics  . Smoking status: Never Smoker   . Smokeless tobacco: Not on file  . Alcohol Use: No  . Drug Use: No  . Sexual Activity: Not on file   Other Topics Concern  . Not on file   Social History Narrative     Objective: BP 145/90 mmHg  Pulse 84  Wt 224 lb (101.606 kg)  Vital signs reviewed. General: Alert and Oriented, No Acute Distress HEENT: Pupils equal, round, reactive to light. Conjunctivae clear.  External ears unremarkable.  Moist mucous membranes. Lungs: Clear and comfortable work of breathing, speaking in full sentences without accessory muscle use. Cardiac: Regular rate and rhythm.  Neuro: CN  II-XII grossly intact, gait normal. Extremities: No peripheral edema.  Strong peripheral pulses.  Mental Status: No depression, anxiety, nor agitation. Logical though process. Skin: Warm and dry. Assessment & Plan: Eric Lynch was seen today for penile discharge.  Diagnoses and all orders for this visit:  Dysuria -     fluconazole (DIFLUCAN) 150 MG tablet; Take one tab, may take second tab if no improvement after 72 hours. -     Urinalysis, Routine w reflex microscopic -     Urine culture   His wife recently needed to be treated for a vaginal yeast infection, most likely that he has about infection in the shaft of his penis. Start Diflucan and sending for urine microscopy along with urine culture. Will update patient if any need to start on antibiotic. Gonorrhea and Chlamydia is extremely low on the differential given his monogamous relationship with his wife   Return if symptoms worsen or fail to improve.

## 2016-05-08 LAB — URINALYSIS, ROUTINE W REFLEX MICROSCOPIC
BILIRUBIN URINE: NEGATIVE
GLUCOSE, UA: NEGATIVE
Hgb urine dipstick: NEGATIVE
KETONES UR: NEGATIVE
Nitrite: NEGATIVE
PH: 7.5 (ref 5.0–8.0)
Protein, ur: NEGATIVE
SPECIFIC GRAVITY, URINE: 1.018 (ref 1.001–1.035)

## 2016-05-08 LAB — URINALYSIS, MICROSCOPIC ONLY
CASTS: NONE SEEN [LPF]
CRYSTALS: NONE SEEN [HPF]
RBC / HPF: NONE SEEN RBC/HPF (ref <=2)
YEAST: NONE SEEN [HPF]

## 2016-05-10 ENCOUNTER — Encounter: Payer: Self-pay | Admitting: Sports Medicine

## 2016-05-10 ENCOUNTER — Other Ambulatory Visit: Payer: Self-pay | Admitting: Sports Medicine

## 2016-05-10 ENCOUNTER — Ambulatory Visit (INDEPENDENT_AMBULATORY_CARE_PROVIDER_SITE_OTHER): Payer: BLUE CROSS/BLUE SHIELD | Admitting: Sports Medicine

## 2016-05-10 VITALS — BP 126/80 | HR 111 | Resp 18 | Wt 221.1 lb

## 2016-05-10 DIAGNOSIS — S81811D Laceration without foreign body, right lower leg, subsequent encounter: Secondary | ICD-10-CM | POA: Diagnosis not present

## 2016-05-10 DIAGNOSIS — R369 Urethral discharge, unspecified: Secondary | ICD-10-CM | POA: Diagnosis not present

## 2016-05-10 DIAGNOSIS — S81811A Laceration without foreign body, right lower leg, initial encounter: Secondary | ICD-10-CM | POA: Insufficient documentation

## 2016-05-10 LAB — URINE CULTURE: ORGANISM ID, BACTERIA: NO GROWTH

## 2016-05-10 NOTE — Progress Notes (Signed)
  Subjective:    CC: Follow-up  HPI: Laceration: Occurred 4 days ago, right thigh, sutures placed, he is here for follow-up. He is on Keflex.  Penile discharge: Present for several days, urinalysis did show 2+ leukocytes, urine culture is negative so far. Has not had gonorrhea or chlamydia testing. Currently on fluconazole.  Past medical history, Surgical history, Family history not pertinant except as noted below, Social history, Allergies, and medications have been entered into the medical record, reviewed, and no changes needed.   Review of Systems: No fevers, chills, night sweats, weight loss, chest pain, or shortness of breath.   Objective:    General: Well Developed, well nourished, and in no acute distress.  Neuro: Alert and oriented x3, extra-ocular muscles intact, sensation grossly intact.  HEENT: Normocephalic, atraumatic, pupils equal round reactive to light, neck supple, no masses, no lymphadenopathy, thyroid nonpalpable.  Skin: Warm and dry, no rashes. Cardiac: Regular rate and rhythm, no murmurs rubs or gallops, no lower extremity edema.  Respiratory: Clear to auscultation bilaterally. Not using accessory muscles, speaking in full sentences. Right leg: There is a 3-4 inch laceration with simple interrupted sutures, minimal warmth but no erythema, induration, or drainage or discharge.  Impression and Recommendations:    I spent 25 minutes with this patient, greater than 50% was face-to-face time counseling regarding the above diagnoses

## 2016-05-10 NOTE — Assessment & Plan Note (Signed)
Urinalysis was 2+ leukocytes, culture is negative, adding gonorrhea and Chlamydia testing.

## 2016-05-10 NOTE — Assessment & Plan Note (Signed)
4 days post 2 layer closure. He will return middle of next week for suture removal. I did give him some Tegaderm to place over his laceration when he needs to be swimming.

## 2016-05-11 LAB — GC/CHLAMYDIA PROBE AMP
CT Probe RNA: NOT DETECTED
GC Probe RNA: NOT DETECTED

## 2016-05-14 MED FILL — LEVOTHYROXINE 100 MCG TAB: 100 | 30 days supply | Qty: 30 | Fill #2

## 2016-05-14 MED FILL — ACYCLOVIR 800 MG TABLET: 800 | 30 days supply | Qty: 60 | Fill #2

## 2016-05-16 ENCOUNTER — Ambulatory Visit (INDEPENDENT_AMBULATORY_CARE_PROVIDER_SITE_OTHER): Payer: BLUE CROSS/BLUE SHIELD | Admitting: Sports Medicine

## 2016-05-16 ENCOUNTER — Encounter: Payer: Self-pay | Admitting: Sports Medicine

## 2016-05-16 DIAGNOSIS — R369 Urethral discharge, unspecified: Secondary | ICD-10-CM | POA: Diagnosis not present

## 2016-05-16 DIAGNOSIS — S81811D Laceration without foreign body, right lower leg, subsequent encounter: Secondary | ICD-10-CM | POA: Diagnosis not present

## 2016-05-16 DIAGNOSIS — E039 Hypothyroidism, unspecified: Secondary | ICD-10-CM

## 2016-05-16 DIAGNOSIS — Z0000 Encounter for general adult medical examination without abnormal findings: Secondary | ICD-10-CM | POA: Diagnosis not present

## 2016-05-16 LAB — CBC
HCT: 45.7 % (ref 38.5–50.0)
Hemoglobin: 15.8 g/dL (ref 13.2–17.1)
MCH: 29.6 pg (ref 27.0–33.0)
MCHC: 34.6 g/dL (ref 32.0–36.0)
MCV: 85.7 fL (ref 80.0–100.0)
MPV: 9.6 fL (ref 7.5–12.5)
Platelets: 263 K/uL (ref 140–400)
RBC: 5.33 MIL/uL (ref 4.20–5.80)
RDW: 13.4 % (ref 11.0–15.0)
WBC: 5.8 K/uL (ref 3.8–10.8)

## 2016-05-16 LAB — COMPREHENSIVE METABOLIC PANEL
Albumin: 4.2 g/dL (ref 3.6–5.1)
CO2: 25 mmol/L (ref 20–31)
Calcium: 9.1 mg/dL (ref 8.6–10.3)
Chloride: 107 mmol/L (ref 98–110)
Glucose, Bld: 138 mg/dL — ABNORMAL HIGH (ref 65–99)
Potassium: 3.9 mmol/L (ref 3.5–5.3)

## 2016-05-16 LAB — LIPID PANEL
Cholesterol: 112 mg/dL — ABNORMAL LOW (ref 125–200)
HDL: 39 mg/dL — ABNORMAL LOW (ref >=40)
LDL Cholesterol: 56 mg/dL (ref <130)
Total CHOL/HDL Ratio: 2.9 Ratio (ref <=5.0)
Triglycerides: 84 mg/dL (ref <150)
VLDL: 17 mg/dL (ref <30)

## 2016-05-16 LAB — HEMOGLOBIN A1C
Hgb A1c MFr Bld: 5.4 % (ref <5.7)
Mean Plasma Glucose: 108 mg/dL

## 2016-05-16 LAB — COMPREHENSIVE METABOLIC PANEL WITH GFR
ALT: 15 U/L (ref 9–46)
AST: 14 U/L (ref 10–40)
Alkaline Phosphatase: 64 U/L (ref 40–115)
BUN: 18 mg/dL (ref 7–25)
Creat: 1.09 mg/dL (ref 0.60–1.35)
Sodium: 140 mmol/L (ref 135–146)
Total Bilirubin: 0.6 mg/dL (ref 0.2–1.2)
Total Protein: 7 g/dL (ref 6.1–8.1)

## 2016-05-16 LAB — TSH: TSH: 0.64 mIU/L (ref 0.40–4.50)

## 2016-05-16 MED ORDER — CIPROFLOXACIN HCL 750 MG PO TABS: 750.0000 mg | ORAL_TABLET | Freq: Two times a day (BID) | ORAL | 0 refills | Status: AC

## 2016-05-16 MED FILL — CIPROFLOXACIN HCL 500 MG TA: 500 | 10 days supply | Qty: 30 | Fill #0

## 2016-05-16 NOTE — Assessment & Plan Note (Signed)
Urinalysis was positive for leukocytes and bacteria, culture was negative, gonorrhea and chlamydia probes were negative. We will check PSA to evaluate for evidence of prostatitis, I'm also going to treat him with empiric ciprofloxacin.

## 2016-05-16 NOTE — Assessment & Plan Note (Signed)
Sutures removed, Dermabond applied.

## 2016-05-16 NOTE — Progress Notes (Signed)
  Subjective:    CC: Follow-up  HPI: Laceration: Here for suture removal.  Hypothyroidism: Due for some blood work  Penile discharge: Negative gonorrhea, chlamydia, urinalysis was positive for leukocytes and bacteria, culture was negative. No pain.  Past medical history, Surgical history, Family history not pertinant except as noted below, Social history, Allergies, and medications have been entered into the medical record, reviewed, and no changes needed.   Review of Systems: No fevers, chills, night sweats, weight loss, chest pain, or shortness of breath.   Objective:    General: Well Developed, well nourished, and in no acute distress.  Neuro: Alert and oriented x3, extra-ocular muscles intact, sensation grossly intact.  HEENT: Normocephalic, atraumatic, pupils equal round reactive to light, neck supple, no masses, no lymphadenopathy, thyroid nonpalpable.  Skin: Warm and dry, no rashes. Cardiac: Regular rate and rhythm, no murmurs rubs or gallops, no lower extremity edema.  Respiratory: Clear to auscultation bilaterally. Not using accessory muscles, speaking in full sentences. Right leg: Sutures were removed, I did place some Dermabond to reinforce the wound. Wound was clean, dry, intact.  Impression and Recommendations:    I spent 25 minutes with this patient, greater than 50% was face-to-face time counseling regarding the above diagnoses

## 2016-05-16 NOTE — Assessment & Plan Note (Signed)
Rechecking TSH 

## 2016-05-17 LAB — PSA, TOTAL AND FREE
PSA, Free Pct: 38 % (ref >25)
PSA, Free: 0.26 ng/mL
PSA: 0.68 ng/mL (ref <=4.00)

## 2016-06-14 MED FILL — LEVOTHYROXINE 100 MCG TAB: 100 | 30 days supply | Qty: 30 | Fill #3

## 2016-06-22 MED FILL — ACYCLOVIR 800 MG TABLET: 800 | 30 days supply | Qty: 60 | Fill #3

## 2016-07-11 MED FILL — LEVOTHYROXINE 100 MCG TAB: 100 | 30 days supply | Qty: 30 | Fill #4

## 2016-08-01 MED FILL — ACYCLOVIR 800 MG TABLET: 800 | 30 days supply | Qty: 60 | Fill #4

## 2016-08-13 MED FILL — LEVOTHYROXINE 100 MCG TAB: 100 | 30 days supply | Qty: 30 | Fill #5

## 2016-09-11 MED FILL — LEVOTHYROXINE 100 MCG TAB: 100 | 30 days supply | Qty: 30 | Fill #6

## 2016-09-11 MED FILL — ACYCLOVIR 800 MG TABLET: 800 | 30 days supply | Qty: 60 | Fill #5

## 2016-09-22 ENCOUNTER — Emergency Department
Admission: EM | Admit: 2016-09-22 | Discharge: 2016-09-22 | Disposition: A | Discharge status: Home / Self Care | Payer: BLUE CROSS/BLUE SHIELD | Source: Home / Self Care | Attending: Family Medicine | Admitting: Family Medicine

## 2016-09-22 ENCOUNTER — Encounter: Payer: Self-pay | Admitting: Emergency Medicine

## 2016-09-22 DIAGNOSIS — J029 Acute pharyngitis, unspecified: Secondary | ICD-10-CM

## 2016-09-22 LAB — POCT RAPID STREP A (OFFICE): Rapid Strep A Screen: NEGATIVE

## 2016-09-22 NOTE — ED Triage Notes (Signed)
Pt c/o sore throat x 1 day, no fever, body aches, congestion, no runny nose.

## 2016-09-22 NOTE — ED Provider Notes (Signed)
CSN: [id number]     Arrival date & time 09/22/16  1714 History   First MD Initiated Contact with Patient 09/22/16 1739     Chief Complaint  Patient presents with  . Sore Throat   (Consider location/radiation/quality/duration/timing/severity/associated sxs/prior Treatment) HPI Eric Lynch is a 35 y.o. male presenting to UC with c/o sore throat that started yesterday.  Throat pain is mild to moderate in severity, worse with swallowing.  Denies fever, chills, body aches, congestion or cough. Denies n/v/d. He has been able to keep down fluids. Denies difficulty breathing. No exposure to strep throat but others at work have been sick.   Past Medical History:  Diagnosis Date  . Allergy   . Thyroid disease   . Vitamin D deficiency    Past Surgical History:  Procedure Laterality Date  . NO PAST SURGERIES     Family History  Problem Relation Age of Onset  . Hypertension Father   . Diabetes Father   . Heart failure Father   . Cancer Father     BLADDER  . Hyperlipidemia Father   . Alcohol abuse Maternal Uncle   . Stroke Maternal Grandmother   . Heart attack Maternal Grandmother   . Alcohol abuse Maternal Grandfather    Social History  Substance Use Topics  . Smoking status: Never Smoker  . Smokeless tobacco: Never Used  . Alcohol use No    Review of Systems  Constitutional: Negative for chills and fever.  HENT: Positive for sore throat. Negative for congestion, ear pain, trouble swallowing and voice change.   Respiratory: Negative for cough and shortness of breath.   Cardiovascular: Negative for chest pain and palpitations.  Gastrointestinal: Negative for abdominal pain, diarrhea, nausea and vomiting.  Musculoskeletal: Negative for arthralgias, back pain and myalgias.  Skin: Negative for rash.  Neurological: Negative for dizziness, light-headedness and headaches.    Allergies  Prednisone  Home Medications   Prior to Admission medications   Medication Sig Start Date  End Date Taking? Authorizing Provider  levothyroxine (SYNTHROID, LEVOTHROID) 100 MCG tablet TAKE 1 TABLET BY MOUTH DAILY. 03/14/16   Monica Bectonhomas J Thekkekandam, MD   Meds Ordered and Administered this Visit  Medications - No data to display  BP 110/77 (BP Location: Left Arm)   Pulse 95   Temp 97.9 F (36.6 C) (Oral)   Ht 5\' 9"  (1.753 m)   Wt 229 lb (103.9 kg)   SpO2 96%   BMI 33.82 kg/m  No data found.   Physical Exam  Constitutional: He is oriented to person, place, and time. He appears well-developed and well-nourished. No distress.  HENT:  Head: Normocephalic and atraumatic.  Right Ear: Tympanic membrane normal.  Left Ear: Tympanic membrane normal.  Nose: Nose normal.  Mouth/Throat: Uvula is midline and mucous membranes are normal. Posterior oropharyngeal erythema present. No oropharyngeal exudate, posterior oropharyngeal edema or tonsillar abscesses.  Eyes: EOM are normal.  Neck: Normal range of motion. Neck supple.  Cardiovascular: Normal rate and regular rhythm.   Pulmonary/Chest: Effort normal and breath sounds normal. No stridor. No respiratory distress. He has no wheezes. He has no rales.  Musculoskeletal: Normal range of motion.  Lymphadenopathy:    He has no cervical adenopathy.  Neurological: He is alert and oriented to person, place, and time.  Skin: Skin is warm and dry. He is not diaphoretic.  Psychiatric: He has a normal mood and affect. His behavior is normal.  Nursing note and vitals reviewed.   Urgent Care Course  Clinical Course     Procedures (including critical care time)  Labs Review Labs Reviewed  STREP A DNA PROBE  POCT RAPID STREP A (OFFICE)    Imaging Review No results found.    MDM   1. Viral pharyngitis    Pt c/o sore throat since yesterday. No evidence of peritonsillar abscess.  Rapid strep: Negative  Symptoms likely viral.  Encouraged symptomatic treatment. F/u with PCP in 1 week if not improving.    Junius Finnerrin O'Malley,  PA-C 09/23/16 1108

## 2016-09-22 NOTE — Discharge Instructions (Signed)

## 2016-09-24 ENCOUNTER — Telehealth: Payer: Self-pay | Admitting: *Deleted

## 2016-09-24 NOTE — Telephone Encounter (Signed)
Spoke to pt given Tcx results. He reports that he is feeling better. Call back if he has any questions or concerns.

## 2016-10-05 MED FILL — LEVOTHYROXINE 100 MCG TAB: 100 | 30 days supply | Qty: 30 | Fill #7

## 2016-11-01 MED FILL — ACYCLOVIR 800 MG TABLET: 800 | 30 days supply | Qty: 60 | Fill #6

## 2016-11-15 MED FILL — LEVOTHYROXINE 100 MCG TAB: 100 | 30 days supply | Qty: 30 | Fill #8

## 2016-12-07 ENCOUNTER — Ambulatory Visit (INDEPENDENT_AMBULATORY_CARE_PROVIDER_SITE_OTHER): Payer: BLUE CROSS/BLUE SHIELD | Admitting: Sports Medicine

## 2016-12-07 ENCOUNTER — Encounter: Payer: Self-pay | Admitting: Sports Medicine

## 2016-12-07 DIAGNOSIS — E039 Hypothyroidism, unspecified: Secondary | ICD-10-CM | POA: Diagnosis not present

## 2016-12-07 LAB — T4, FREE: Free T4: 1.3 ng/dL (ref 0.8–1.8)

## 2016-12-07 LAB — T3, FREE: T3, Free: 3.6 pg/mL (ref 2.3–4.2)

## 2016-12-07 LAB — TSH: TSH: 0.36 mIU/L — ABNORMAL LOW (ref 0.40–4.50)

## 2016-12-07 NOTE — Assessment & Plan Note (Addendum)
Patient is concerned that his difficulty losing weight may be due to insufficient or inadequate combination of T3 and T4, his most recent TSH was normal, I have agreed to check T3, T4, and TSH in the hopes of giving him some clarity. We did discuss that inability to lose weight was more likely related to excessive calorie intake. Certainly we could add Cytomel if T3 is low, this will be more for patient satisfaction than clinical efficacy..Marland Kitchen

## 2016-12-07 NOTE — Progress Notes (Signed)
  Subjective:    CC: Questions about hypothyroidism  HPI: This is a pleasant 36 year old male, he's had some difficulty losing weight, and has known hypothyroidism, controlled with levothyroxine 100 g, considering his difficulty losing weight he was curious as to whether he was getting the appropriate combination's of T3 and T4. He really would like to check his levels, more so than start any treatment for weight loss.  Past medical history:  Negative.  See flowsheet/record as well for more information.  Surgical history: Negative.  See flowsheet/record as well for more information.  Family history: Negative.  See flowsheet/record as well for more information.  Social history: Negative.  See flowsheet/record as well for more information.  Allergies, and medications have been entered into the medical record, reviewed, and no changes needed.   Review of Systems: No fevers, chills, night sweats, weight loss, chest pain, or shortness of breath.   Objective:    General: Well Developed, well nourished, and in no acute distress.  Neuro: Alert and oriented x3, extra-ocular muscles intact, sensation grossly intact.  HEENT: Normocephalic, atraumatic, pupils equal round reactive to light, neck supple, no masses, no lymphadenopathy, thyroid nonpalpable.  Skin: Warm and dry, no rashes. Cardiac: Regular rate and rhythm, no murmurs rubs or gallops, no lower extremity edema.  Respiratory: Clear to auscultation bilaterally. Not using accessory muscles, speaking in full sentences.  Impression and Recommendations:    Hypothyroidism Patient is concerned that his difficulty losing weight may be due to insufficient or inadequate combination of T3 and T4, his most recent TSH was normal, I have agreed to check T3, T4, and TSH in the hopes of giving him some clarity. We did discuss that inability to lose weight was more likely related to excessive calorie intake. Certainly we could add Cytomel if T3 is low,  this will be more for patient satisfaction than clinical efficacy..  I spent 25 minutes with this patient, greater than 50% was face-to-face time counseling regarding the above diagnoses

## 2016-12-13 ENCOUNTER — Other Ambulatory Visit: Payer: Self-pay | Admitting: Sports Medicine

## 2016-12-13 MED FILL — LEVOTHYROXINE 100 MCG TAB: 100 | 30 days supply | Qty: 30 | Fill #9

## 2016-12-13 MED FILL — ACYCLOVIR 800 MG TABLET: 800 | 30 days supply | Qty: 60 | Fill #0

## 2017-01-14 MED FILL — LEVOTHYROXINE 100 MCG TAB: 100 | 30 days supply | Qty: 30 | Fill #10

## 2017-01-21 MED FILL — ACYCLOVIR 800 MG TABLET: 800 | 30 days supply | Qty: 60 | Fill #1

## 2017-02-07 ENCOUNTER — Ambulatory Visit (INDEPENDENT_AMBULATORY_CARE_PROVIDER_SITE_OTHER): Payer: BLUE CROSS/BLUE SHIELD | Admitting: Sports Medicine

## 2017-02-07 DIAGNOSIS — M791 Myalgia, unspecified site: Secondary | ICD-10-CM | POA: Insufficient documentation

## 2017-02-07 DIAGNOSIS — E039 Hypothyroidism, unspecified: Secondary | ICD-10-CM

## 2017-02-07 LAB — COMPREHENSIVE METABOLIC PANEL WITH GFR
ALT: 37 U/L (ref 9–46)
AST: 25 U/L (ref 10–40)
Alkaline Phosphatase: 63 U/L (ref 40–115)
BUN: 17 mg/dL (ref 7–25)
CO2: 19 mmol/L — ABNORMAL LOW (ref 20–31)
Calcium: 8.7 mg/dL (ref 8.6–10.3)
Glucose, Bld: 81 mg/dL (ref 65–99)
Potassium: 4.1 mmol/L (ref 3.5–5.3)
Total Bilirubin: 0.8 mg/dL (ref 0.2–1.2)

## 2017-02-07 LAB — COMPREHENSIVE METABOLIC PANEL
Albumin: 4.2 g/dL (ref 3.6–5.1)
Chloride: 107 mmol/L (ref 98–110)
Creat: 1.06 mg/dL (ref 0.60–1.35)
Sodium: 139 mmol/L (ref 135–146)
Total Protein: 6.9 g/dL (ref 6.1–8.1)

## 2017-02-07 LAB — CBC WITH DIFFERENTIAL/PLATELET
Basophils Absolute: 0 cells/uL (ref 0–200)
Basophils Relative: 0 %
Eosinophils Absolute: 203 cells/uL (ref 15–500)
Eosinophils Relative: 7 %
HCT: 45.9 % (ref 38.5–50.0)
Hemoglobin: 15.8 g/dL (ref 13.2–17.1)
Lymphocytes Relative: 19 %
Lymphs Abs: 551 {cells}/uL — ABNORMAL LOW (ref 850–3900)
MCH: 29.2 pg (ref 27.0–33.0)
MCHC: 34.4 g/dL (ref 32.0–36.0)
MCV: 84.7 fL (ref 80.0–100.0)
MPV: 10 fL (ref 7.5–12.5)
Monocytes Absolute: 609 {cells}/uL (ref 200–950)
Monocytes Relative: 21 %
Neutro Abs: 1537 cells/uL (ref 1500–7800)
Neutrophils Relative %: 53 %
Platelets: 185 K/uL (ref 140–400)
RBC: 5.42 MIL/uL (ref 4.20–5.80)
RDW: 13.8 % (ref 11.0–15.0)
WBC: 2.9 K/uL — ABNORMAL LOW (ref 3.8–10.8)

## 2017-02-07 LAB — TSH: TSH: 0.34 mIU/L — ABNORMAL LOW (ref 0.40–4.50)

## 2017-02-07 LAB — CK: Total CK: 88 U/L (ref 7–232)

## 2017-02-07 NOTE — Assessment & Plan Note (Addendum)
Unclear etiology, differential is wide including heat exhaustion, exertional rhabdomyolysis, thyroid-induced myopathy. Checking a panel blood work including CK levels. I advised him to maintain a general state of hydration, if his blood work is negative I'm going to have him walk on a treadmill or running treadmill until we can reproduce his symptoms and then recheck CK levels as well as a temperature.  Also WBC count is low, suggesting concurrent viral illness.   We should recheck this at the followup visit too.

## 2017-02-07 NOTE — Patient Instructions (Signed)
Heat Exhaustion Information WHAT IS HEAT EXHAUSTION? Heat exhaustion happens when your body gets overheated from hot weather or from exercise. Heat exhaustion can lead to heat stroke, a life-threatening condition that requires emergency care. Heat exhaustion is more likely to develop when:  You are exercising or being active.  You are in hot or humid weather.  You are in bright sunshine.  You are not drinking enough water. WHO IS AT RISK FOR THIS CONDITION? This condition is more likely to develop in:  People who exercise in hot or humid weather.  People who exercise beyond their fitness level.  People who wear clothing that does not allow sweat to evaporate.  People who are dehydrated.  People who drink a lot of alcoholic beverages or beverages that have caffeine. This can lead to dehydration.  People who are age 65 or older.  Children.  People who have a medical condition such as heart disease, poor circulation, sickle cell disease, or high blood pressure.  People who have a fever.  People who are very overweight (obese). WHAT ARE THE SYMPTOMS OF THIS CONDITION? Symptoms of heat exhaustion include:  Heavy sweating along with feeling weak, dizzy, light-headed, and nauseous.  Rapid heartbeat.  Headache.  Urine that is darker than normal.  Muscle cramps, such as in the leg or side (flank).  Moist, cool, and clammy skin.  Fatigue.  Thirst.  Confusion.  Fainting. WHAT SHOULD I DO IF I THINK I HAVE THIS CONDITION? If you think that you have heat exhaustion, call your health care provider. Follow his or her instructions. You should also:  Call a friend or a family member and ask him or her to stay with you.  Move to a cooler location, such as:  Into the shade.  In front of a fan.  An air-conditioned space.  Lie down and rest.  Slowly drink nonalcoholic, caffeine-free fluids.  Take off tight clothing or extra clothing.  Take a cool bath or shower,  if possible. If you do not have access to a bath or shower, dab or mist cool water on your skin. WHY IS IT IMPORTANT TO TREAT THIS CONDITION? It is important to take care of yourself and treat heat exhaustion as soon as possible. Untreated heat exhaustion can turn into heat stroke, which is a life-threatening condition that requires urgent medical treatment. HOW CAN I PREVENT THIS CONDITION? To prevent this condition:  Drink enough fluid to keep your urine clear or pale yellow. This helps your body to sweat properly.  Avoid outdoor activities on very hot or humid days.  Do not exercise or do other physical activity when you are not feeling well.  Take breaks often during physical activity.  Wear light-colored, loose-fitting, and lightweight clothing when it is hot outside.  Wear a hat and use sunscreen when exercising outdoors.  Avoid being outside during the hottest times of the day.  Check with your health care provider before you start any new activity, especially if you take medicine or have a medical condition.  Start any new activity slowly and work up to your fitness level. HOW CAN I HELP TO PROTECT ELDERLY RELATIVES AND NEIGHBORS FROM THIS CONDITION? People who are age 65 or older are at greater risk for heat exhaustion. Their bodies have a harder time adjusting to heat. They are also more likely to have a medical condition or be on medicines that increase their risk for heat exhaustion. They may get heat exhaustion indoors if the heat is   high for several days. You can help to protect them during hot weather by:  Checking on them two or more times each day.  Making sure that they are drinking plenty of cool, nonalcoholic, and caffeine-free fluids.  Making sure that they use their air conditioner.  Taking them to a location where air conditioning is available.  Talking with their health care provider about their medical needs, medicines, and fluid requirements. SEEK MEDICAL  CARE IF:  Your symptoms last longer than 30 minutes. SEEK IMMEDIATE MEDICAL CARE IF:  You have any symptoms of heat stroke. These include:  Fever.  Vomiting.  Red skin.  Inability to sweat, resulting in hot, dry skin.  Excessive thirst.  Rapid breathing.  Headache.  Confusion or disorientation.  Fainting.  Seizures. These symptoms may represent a serious problem that is an emergency. Do not wait to see if the symptoms will go away. Get medical help right away. Call your local emergency services (911 in the [place] Do not drive yourself to the hospital. This information is not intended to replace advice given to you by your health care provider. Make sure you discuss any questions you have with your health care provider. Document Released: 07/17/2008 Document Revised: 04/27/2016 Document Reviewed: 01/29/2016 Elsevier Interactive Patient Education  2017 [employer]  

## 2017-02-07 NOTE — Progress Notes (Signed)
  Subjective:    CC: fever and muscle aches when exercising  HPI: Mr. Eric Lynch is a 36 y.o. Male w/ a history of hypothyroidism who presents with fever and diffuse muscle aches when exercising or working in the yard. He reports that any time he is active, he starts feeling feverish and his joints and muscles ache all over his body, including hands, legs, back, and neck. He has not measured a temperature when this has happened, but symptoms resolve a couple hours after taking tylenol. He denies any URI symptoms or GI symptoms. He says it feels like the flu but without the URI symptoms. He denies any focal pain or changes in his urine. He does not have a fever on presentation, but he does report that he currently feels warm.   Past medical history:  Hypothyroidism.  See flowsheet/record as well for more information.  Surgical history: Negative.  See flowsheet/record as well for more information.  Family history: Negative.  See flowsheet/record as well for more information.  Social history: Negative.  See flowsheet/record as well for more information.  Allergies, and medications have been entered into the medical record, reviewed, and no changes needed.   Review of Systems: No chills, night sweats, weight loss, chest pain, or shortness of breath.   Objective:    General: Well Developed, well nourished, and in no acute distress.  Neuro: Alert and oriented x3, extra-ocular muscles intact, sensation grossly intact.  HEENT: Normocephalic, atraumatic, pupils equal round reactive to light, neck supple, no masses, no lymphadenopathy, thyroid nonpalpable.  Skin: Warm and dry, no rashes. Cardiac: Regular rate and rhythm, no murmurs rubs or gallops, no lower extremity edema.  Respiratory: Clear to auscultation bilaterally. Not using accessory muscles, speaking in full sentences.   Impression and Recommendations:    Mr. Eric Lynch is a 36 y.o. Male with a history of hypothyroidism who presents with myalgia  secondary to exertion.   Unclear etiology at this point, but differential includes heat exhaustion, exertional rhabdomyolysis, and thyroid induced myopathy. Blood panel ordered including CK levels. Patient advised to stay hydrated. If blood work is negative, we will follow-up with a stress test until symptoms are reproduced, and recheck temperature and CK levels during acute attack.

## 2017-02-08 LAB — URINALYSIS
Bilirubin Urine: NEGATIVE
Glucose, UA: NEGATIVE
Hgb urine dipstick: NEGATIVE
Ketones, ur: NEGATIVE
Leukocytes, UA: NEGATIVE
Nitrite: NEGATIVE
Protein, ur: NEGATIVE
Specific Gravity, Urine: 1.022 (ref 1.001–1.035)
pH: 6.5 (ref 5.0–8.0)

## 2017-02-08 MED ORDER — LEVOTHYROXINE SODIUM 88 MCG PO TABS: 88.0000 ug | ORAL_TABLET | Freq: Every day | ORAL | 3 refills | Status: DC

## 2017-02-08 MED FILL — LEVOTHYROXINE 88 MCG TABLET: 88 | 30 days supply | Qty: 30 | Fill #0

## 2017-02-08 NOTE — Addendum Note (Signed)
Addended by: Monica Becton on: 02/08/2017 11:24 AM   Modules accepted: Orders

## 2017-02-08 NOTE — Assessment & Plan Note (Signed)
TSH is low suggesting overtreatment with levothyroxine.  Decreasing dose to 88 mcg, recheck in 6 weeks.  Also WBC count is low, suggesting concurrent viral illness.   We should recheck this at the followup visit too.

## 2017-02-18 ENCOUNTER — Ambulatory Visit (INDEPENDENT_AMBULATORY_CARE_PROVIDER_SITE_OTHER): Payer: BLUE CROSS/BLUE SHIELD

## 2017-02-18 ENCOUNTER — Encounter: Payer: Self-pay | Admitting: Family Medicine

## 2017-02-18 ENCOUNTER — Ambulatory Visit (INDEPENDENT_AMBULATORY_CARE_PROVIDER_SITE_OTHER): Payer: BLUE CROSS/BLUE SHIELD | Admitting: Family Medicine

## 2017-02-18 VITALS — BP 132/79 | HR 94 | Temp 97.9°F | Wt 223.0 lb

## 2017-02-18 DIAGNOSIS — M791 Myalgia, unspecified site: Secondary | ICD-10-CM

## 2017-02-18 DIAGNOSIS — E039 Hypothyroidism, unspecified: Secondary | ICD-10-CM | POA: Diagnosis not present

## 2017-02-18 DIAGNOSIS — R5383 Other fatigue: Secondary | ICD-10-CM

## 2017-02-18 DIAGNOSIS — R21 Rash and other nonspecific skin eruption: Secondary | ICD-10-CM

## 2017-02-18 DIAGNOSIS — R05 Cough: Secondary | ICD-10-CM | POA: Diagnosis not present

## 2017-02-18 LAB — CMP 10231
AG Ratio: 1.4 Ratio (ref 1.0–2.5)
ALK PHOS: 74 U/L (ref 40–115)
ALT: 48 U/L — ABNORMAL HIGH (ref 9–46)
AST: 21 U/L (ref 10–40)
Albumin: 4.2 g/dL (ref 3.6–5.1)
BUN / CREAT RATIO: 9.4 ratio (ref 6–22)
BUN: 11 mg/dL (ref 7–25)
CHLORIDE: 106 mmol/L (ref 98–110)
CO2: 23 mmol/L (ref 20–31)
Calcium: 9.2 mg/dL (ref 8.6–10.3)
Creat: 1.17 mg/dL (ref 0.60–1.35)
GFR, Est Non African American: 80 mL/min (ref >=60)
Globulin: 3.1 g/dL (ref 1.9–3.7)
Glucose, Bld: 80 mg/dL (ref 65–99)
POTASSIUM: 4.2 mmol/L (ref 3.5–5.3)
SODIUM: 142 mmol/L (ref 135–146)
Total Bilirubin: 0.7 mg/dL (ref 0.2–1.2)
Total Protein: 7.3 g/dL (ref 6.1–8.1)

## 2017-02-18 LAB — CBC WITH DIFFERENTIAL/PLATELET
BASOS PCT: 0 %
Basophils Absolute: 0 cells/uL (ref 0–200)
Eosinophils Absolute: 208 cells/uL (ref 15–500)
Eosinophils Relative: 4 %
HCT: 46.9 % (ref 38.5–50.0)
Hemoglobin: 16.1 g/dL (ref 13.2–17.1)
LYMPHS PCT: 19 %
Lymphs Abs: 988 cells/uL (ref 850–3900)
MCH: 29.3 pg (ref 27.0–33.0)
MCHC: 34.3 g/dL (ref 32.0–36.0)
MCV: 85.4 fL (ref 80.0–100.0)
MONO ABS: 312 {cells}/uL (ref 200–950)
MONOS PCT: 6 %
MPV: 9 fL (ref 7.5–12.5)
Neutro Abs: 3692 cells/uL (ref 1500–7800)
Neutrophils Relative %: 71 %
PLATELETS: 278 10*3/uL (ref 140–400)
RBC: 5.49 MIL/uL (ref 4.20–5.80)
RDW: 14 % (ref 11.0–15.0)
WBC: 5.2 10*3/uL (ref 3.8–10.8)

## 2017-02-18 LAB — COMPREHENSIVE METABOLIC PANEL
AG Ratio: 1.4 Ratio (ref 1.0–2.5)
ALBUMIN: 4.2 g/dL (ref 3.6–5.1)
ALT: 48 U/L — AB (ref 9–46)
AST: 21 U/L (ref 10–40)
Alkaline Phosphatase: 74 U/L (ref 40–115)
BUN/Creatinine Ratio: 9.4 Ratio (ref 6–22)
BUN: 11 mg/dL (ref 7–25)
CO2: 23 mmol/L (ref 20–31)
Calcium: 9.2 mg/dL (ref 8.6–10.3)
Chloride: 106 mmol/L (ref 98–110)
Creat: 1.17 mg/dL (ref 0.60–1.35)
GFR, EST NON AFRICAN AMERICAN: 80 mL/min (ref >=60)
GFR, Est African American: >89 mL/min (ref >=60)
GLOBULIN: 3.1 g/dL (ref 1.9–3.7)
GLUCOSE: 80 mg/dL (ref 65–99)
POTASSIUM: 4.2 mmol/L (ref 3.5–5.3)
Sodium: 142 mmol/L (ref 135–146)
TOTAL PROTEIN: 7.3 g/dL (ref 6.1–8.1)
Total Bilirubin: 0.7 mg/dL (ref 0.2–1.2)

## 2017-02-18 LAB — IRON AND TIBC
%SAT: 24 % (ref 15–60)
IRON: 63 ug/dL (ref 50–180)
TIBC: 267 ug/dL (ref 250–425)
UIBC: 204 ug/dL (ref 125–400)

## 2017-02-18 LAB — FERRITIN: FERRITIN: 204 ng/mL (ref 20–345)

## 2017-02-18 LAB — T4, FREE: FREE T4: 1.2 ng/dL (ref 0.8–1.8)

## 2017-02-18 LAB — T3, FREE: T3, Free: 3 pg/mL (ref 2.3–4.2)

## 2017-02-18 LAB — FOLATE: Folate: 7 ng/mL (ref >5.4)

## 2017-02-18 LAB — VITAMIN B12: VITAMIN B 12: 583 pg/mL (ref 200–1100)

## 2017-02-18 LAB — TSH: TSH: 0.71 mIU/L (ref 0.40–4.50)

## 2017-02-18 MED ORDER — AMOXICILLIN 500 MG PO CAPS: 500.0000 mg | ORAL_CAPSULE | Freq: Three times a day (TID) | ORAL | 0 refills | Status: DC

## 2017-02-18 MED FILL — AMOXICILLIN 500 MG CAPSULE: 500 | 7 days supply | Qty: 21 | Fill #0

## 2017-02-18 NOTE — Progress Notes (Signed)
Eric Lynch is a 36 y.o. male who presents to [facility]: Primary Care Sports Medicine today for fatigue and rash. Patient notes a ongoing course of fatigue and muscle aches that has been partially worked up by his primary care provider. He's developed a macular rash over the last 36 hours or so. He's been taking Benadryl which helps. He notes with onset of rashes been feeling poorly. He notes fatigue muscle aches and pain and feeling run down. He denies any vomiting diarrhea chest pain palpitations or sore throat. He notes positive sick contacts with a son who has strep throat. He denies any tick exposures or new medications. His levothyroxine dose was recently decreased but he notes that he's been feeling fatigued prior to the change in levothyroxine. He denies any new medications or exposures.   Past Medical History:  Diagnosis Date  . Allergy   . Thyroid disease   . Vitamin D deficiency    Past Surgical History:  Procedure Laterality Date  . NO PAST SURGERIES     Social History  Substance Use Topics  . Smoking status: Never Smoker  . Smokeless tobacco: Never Used  . Alcohol use No   family history includes Alcohol abuse in his maternal grandfather and maternal uncle; Cancer in his father; Diabetes in his father; Heart attack in his maternal grandmother; Heart failure in his father; Hyperlipidemia in his father; Hypertension in his father; Stroke in his maternal grandmother.  ROS as above:  Medications: Current Outpatient Prescriptions  Medication Sig Dispense Refill  . acyclovir (ZOVIRAX) 800 MG tablet TAKE 1 TABLET BY MOUTH 2 TIMES DAILY. 60 tablet 6  . levothyroxine (SYNTHROID, LEVOTHROID) 88 MCG tablet Take 1 tablet (88 mcg total) by mouth daily. 30 tablet 3  . amoxicillin (AMOXIL) 500 MG capsule Take 1 capsule (500 mg total) by mouth 3 (three) times daily. 21 capsule 0   No current  facility-administered medications for this visit.    Allergies  Allergen Reactions  . Prednisone     Health Maintenance Health Maintenance  Topic Date Due  . TETANUS/TDAP  08/20/1999  . INFLUENZA VACCINE  05/22/2017  . HIV Screening  Completed     Exam:  BP 132/79   Pulse 94   Temp 97.9 F (36.6 C) (Oral)   Wt 223 lb (101.2 kg)   BMI 32.93 kg/m  Gen: Well NAD HEENT: EOMI,  MMM Normal posterior pharynx Lungs: Normal work of breathing. CTABL Heart: RRR no MRG Abd: NABS, Soft. Nondistended, Nontender Exts: Brisk capillary refill, warm and well perfused.  Skin: Macular erythematous rash on trunk and upper extremities. Blanchable. Nontender. MSK: No boggy synovitis on extremities.   No results found for this or any previous visit (from the past 72 hour(s)). No results found.    Assessment and Plan: 36 y.o. male with rash associated with fatigue and myalgias with strep exposure. Discussed options. Plan for extensive rheumatologic and infectious workup. Plan to change chest x-ray as well as fatigue and room workup listed below. Will treat possible strep with amoxicillin. Patient may have scarlet fever to explain his fatigue and rash. We'll recheck labs and follow-up with PCP in the near future.   Orders Placed This Encounter  Procedures  . DG Chest 2 View    Order Specific Question:   Reason for exam:    Answer:   Cough, assess intra-thoracic pathology    Order Specific Question:   Preferred imaging location?  Answer:   [facility]  . CBC with Differential/Platelet  . COMPLETE METABOLIC PANEL WITH GFR  . Folate  . Vitamin B12  . Ferritin  . Iron and TIBC  . Magnesium  . TSH  . T4, free  . T3, free  . VITAMIN D 25 Hydroxy (Vit-D Deficiency, Fractures)  . Cyclic citrul peptide antibody, IgG  . Sedimentation rate  . ANA  . Rheumatoid factor  . CK  . B. burgdorfi antibodies  . [place] mtn spotted fvr abs pnl(IgG+IgM)  . Complement, total  .  Angiotensin converting enzyme   Meds ordered this encounter  Medications  . amoxicillin (AMOXIL) 500 MG capsule    Sig: Take 1 capsule (500 mg total) by mouth 3 (three) times daily.    Dispense:  21 capsule    Refill:  0     Discussed warning signs or symptoms. Please see discharge instructions. Patient expresses understanding.

## 2017-02-18 NOTE — Patient Instructions (Signed)
Thank you for coming in today.  Get labs today as well as xray today. Take amoxicillin  Recheck with Dr T in 2-4 weeks.

## 2017-02-19 LAB — SEDIMENTATION RATE: SED RATE: 4 mm/h (ref 0–15)

## 2017-02-19 LAB — ANGIOTENSIN CONVERTING ENZYME: ANGIOTENSIN-CONVERTING ENZYME: 28 U/L (ref 9–67)

## 2017-02-19 LAB — CYCLIC CITRUL PEPTIDE ANTIBODY, IGG

## 2017-02-19 LAB — ANTI-NUCLEAR AB-TITER (ANA TITER): ANA Titer 1: 1:80 {titer} — ABNORMAL HIGH

## 2017-02-19 LAB — RHEUMATOID FACTOR: Rhuematoid fact SerPl-aCnc: <14 IU/mL (ref <14)

## 2017-02-19 LAB — ANA: ANA: POSITIVE — AB

## 2017-02-19 LAB — LYME AB/WESTERN BLOT REFLEX: B burgdorferi Ab IgG+IgM: <0.9 Index (ref <0.90)

## 2017-02-19 LAB — VITAMIN D 25 HYDROXY (VIT D DEFICIENCY, FRACTURES): Vit D, 25-Hydroxy: 74 ng/mL (ref 30–100)

## 2017-02-19 LAB — MAGNESIUM: Magnesium: 2.1 mg/dL (ref 1.5–2.5)

## 2017-02-19 LAB — CK: CK TOTAL: 40 U/L (ref 7–232)

## 2017-02-20 LAB — ROCKY MTN SPOTTED FVR ABS PNL(IGG+IGM)
RMSF IgG: NOT DETECTED
RMSF IgM: NOT DETECTED

## 2017-02-20 LAB — COMPLEMENT, TOTAL: Compl, Total (CH50): 41 U/mL (ref 31–60)

## 2017-02-22 ENCOUNTER — Telehealth: Payer: Self-pay

## 2017-02-22 NOTE — Telephone Encounter (Signed)
PT received a positive ANA and would like to discuss what this means.

## 2017-02-25 NOTE — Telephone Encounter (Signed)
It's a fairly long discussion to actually describe what the test is, but he had a low titer indicating this is most likely a false positive.  Looking at the other labs which are negative he really doesn't need to worry about it.

## 2017-02-25 NOTE — Telephone Encounter (Signed)
Would you like me to call with this information or will Leta call him?

## 2017-02-25 NOTE — Telephone Encounter (Signed)
Sorry, I'll route it to [facility].

## 2017-02-25 NOTE — Telephone Encounter (Signed)
Patient notified

## 2017-03-12 MED FILL — LEVOTHYROXINE 88 MCG TABLET: 88 | 30 days supply | Qty: 30 | Fill #1

## 2017-03-12 MED FILL — ACYCLOVIR 800 MG TABLET: 800 | 30 days supply | Qty: 60 | Fill #2

## 2017-04-15 MED FILL — LEVOTHYROXINE 88 MCG TABLET: 88 | 30 days supply | Qty: 30 | Fill #2

## 2017-04-30 MED FILL — ACYCLOVIR 800 MG TABLET: 800 | 30 days supply | Qty: 60 | Fill #3

## 2017-05-16 MED FILL — LEVOTHYROXINE 88 MCG TABLET: 88 | 30 days supply | Qty: 30 | Fill #3

## 2017-05-20 ENCOUNTER — Emergency Department
Admission: EM | Admit: 2017-05-20 | Discharge: 2017-05-20 | Disposition: A | Discharge status: Home / Self Care | Payer: BLUE CROSS/BLUE SHIELD | Source: Home / Self Care | Attending: Family Medicine | Admitting: Family Medicine

## 2017-05-20 ENCOUNTER — Encounter: Payer: Self-pay | Admitting: *Deleted

## 2017-05-20 DIAGNOSIS — K0889 Other specified disorders of teeth and supporting structures: Secondary | ICD-10-CM

## 2017-05-20 MED ORDER — AMOXICILLIN 875 MG PO TABS: 875.0000 mg | ORAL_TABLET | Freq: Two times a day (BID) | ORAL | 0 refills | Status: DC

## 2017-05-20 NOTE — Discharge Instructions (Signed)
May take Ibuprofen 200mg, 4 tabs every 8 hours with food.   If symptoms become significantly worse during the night or over the weekend, proceed to the local emergency room.  

## 2017-05-20 NOTE — ED Triage Notes (Signed)
Pt c/o sinus pressure and LT jaw pain x 1 day. Denies nasal congestion and fever.

## 2017-05-20 NOTE — ED Provider Notes (Signed)
[facility]    CSN: [id number] Arrival date & time: 05/20/17  1440     History   Chief Complaint Chief Complaint  Patient presents with  . sinus pressure    HPI Eric Lynch is a 36 y.o. male.   Patient complains of pain in his left face/jaw that started yesterday.  He does not have a definite toothache.  He has had a headache and chills but no fever.  No sinus congestion.   The history is provided by the patient.    Past Medical History:  Diagnosis Date  . Allergy   . Thyroid disease   . Vitamin D deficiency     Patient Active Problem List   Diagnosis Date Noted  . Myalgia 02/07/2017  . Laceration of leg, right 05/10/2016  . Nocturia 12/31/2014  . Anxiety 12/31/2014  . Annual physical exam 11/02/2014  . 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 Obesity (BMI 30-39.9) 02/08/2014  . Hypothyroidism   . Allergy   . ADD 12/04/2006    Past Surgical History:  Procedure Laterality Date  . NO PAST SURGERIES         Home Medications    Prior to Admission medications   Medication Sig Start Date End Date Taking? Authorizing Provider  acyclovir (ZOVIRAX) 800 MG tablet TAKE 1 TABLET BY MOUTH 2 TIMES DAILY. 12/13/16   Monica Bectonhekkekandam, Thomas J, MD  amoxicillin (AMOXIL) 875 MG tablet Take 1 tablet (875 mg total) by mouth 2 (two) times daily. 05/20/17   Lattie HawBeese, Via Rosado A, MD  levothyroxine (SYNTHROID, LEVOTHROID) 88 MCG tablet Take 1 tablet (88 mcg total) by mouth daily. 02/08/17   Monica Bectonhekkekandam, Thomas J, MD    Family History Family History  Problem Relation Age of Onset  . Hypertension Father   . Diabetes Father   . Heart failure Father   . Cancer Father        BLADDER  . Hyperlipidemia Father   . Alcohol abuse Maternal Uncle   . Stroke Maternal Grandmother   . Heart attack Maternal Grandmother   . Alcohol abuse Maternal Grandfather     Social History Social History  Substance Use Topics  . Smoking status: Never Smoker  . Smokeless tobacco: Never Used    . Alcohol use No     Allergies   Prednisone   Review of Systems Review of Systems  Constitutional: Positive for chills and fatigue. Negative for activity change, appetite change, diaphoresis and fever.  HENT: Negative for congestion, ear pain, facial swelling, mouth sores, rhinorrhea, sinus pressure, sneezing, sore throat and trouble swallowing.   Eyes: Negative.   Respiratory: Negative.   Cardiovascular: Negative.   Gastrointestinal: Negative.   Genitourinary: Negative.   Musculoskeletal: Negative.   Neurological: Positive for headaches.     Physical Exam Triage Vital Signs ED Triage Vitals  Enc Vitals Group     BP 05/20/17 1513 120/85     Pulse Rate 05/20/17 1513 95     Resp 05/20/17 1513 16     Temp 05/20/17 1513 98.8 F (37.1 C)     Temp Source 05/20/17 1513 Oral     SpO2 05/20/17 1513 98 %     Weight 05/20/17 1514 219 lb (99.3 kg)     Height 05/20/17 1514 5\' 9"  (1.753 m)     Head Circumference --      Peak Flow --      Pain Score 05/20/17 1518 0     Pain Loc --  Pain Edu? --      Excl. in GC? --    No data found.   Updated Vital Signs BP 120/85 (BP Location: Left Arm)   Pulse 95   Temp 98.8 F (37.1 C) (Oral)   Resp 16   Ht 5\' 9"  (1.753 m)   Wt 219 lb (99.3 kg)   SpO2 98%   BMI 32.34 kg/m   Visual Acuity Right Eye Distance:   Left Eye Distance:   Bilateral Distance:    Right Eye Near:   Left Eye Near:    Bilateral Near:     Physical Exam  Constitutional: He appears well-developed and well-nourished. No distress.  HENT:  Head: Normocephalic.  Right Ear: Tympanic membrane, external ear and ear canal normal.  Left Ear: Tympanic membrane, external ear and ear canal normal.  Nose: Nose normal.  Mouth/Throat:    Gingiva adjacent to tooth #17 tender to palpation.  Eyes: Pupils are equal, round, and reactive to light. Conjunctivae are normal.  Neck: Neck supple.  Cardiovascular: Normal rate.   Pulmonary/Chest: Effort normal.   Lymphadenopathy:    He has no cervical adenopathy.  Neurological: He is alert.  Skin: Skin is warm and dry.  Nursing note and vitals reviewed.    UC Treatments / Results  Labs (all labs ordered are listed, but only abnormal results are displayed) Labs Reviewed - No data to display  EKG  EKG Interpretation None       Radiology No results found.  Procedures Procedures (including critical care time)  Medications Ordered in UC Medications - No data to display   Initial Impression / Assessment and Plan / UC Course  I have reviewed the triage vital signs and the nursing notes.  Pertinent labs & imaging results that were available during my care of the patient were reviewed by me and considered in my medical decision making (see chart for details).    ?gingivitis vs ?early periapical abscess. Begin amoxicillin 875mg  BID May take Ibuprofen 200mg , 4 tabs every 8 hours with food.  If symptoms become significantly worse during the night or over the weekend, proceed to the local emergency room.  Followup with dentist as soon as possible.     Final Clinical Impressions(s) / UC Diagnoses   Final diagnoses:  Pain, dental    New Prescriptions New Prescriptions   AMOXICILLIN (AMOXIL) 875 MG TABLET    Take 1 tablet (875 mg total) by mouth 2 (two) times daily.     Lattie HawBeese, Reita Shindler A, MD 05/21/17 [phone]

## 2017-06-06 ENCOUNTER — Encounter: Payer: Self-pay | Admitting: Family Medicine

## 2017-06-06 ENCOUNTER — Ambulatory Visit (INDEPENDENT_AMBULATORY_CARE_PROVIDER_SITE_OTHER): Payer: BLUE CROSS/BLUE SHIELD | Admitting: Family Medicine

## 2017-06-06 VITALS — BP 127/69 | HR 102 | Temp 98.3°F | Wt 218.0 lb

## 2017-06-06 DIAGNOSIS — J0100 Acute maxillary sinusitis, unspecified: Secondary | ICD-10-CM | POA: Diagnosis not present

## 2017-06-06 MED ORDER — BENZONATATE 200 MG PO CAPS: 200.0000 mg | ORAL_CAPSULE | Freq: Three times a day (TID) | ORAL | 0 refills | Status: DC | PRN

## 2017-06-06 MED ORDER — AZITHROMYCIN 250 MG PO TABS: 250.0000 mg | ORAL_TABLET | Freq: Every day | ORAL | 0 refills | Status: DC

## 2017-06-06 NOTE — Patient Instructions (Signed)
Thank you for coming in today. Take azithromycin daily for 5 days.  Continue the over the counter medicines.  Use tessalon for cough as needed.  Call or go to the emergency room if you get worse, have trouble breathing, have chest pains, or palpitations.    Sinusitis, Adult Sinusitis is soreness and inflammation of your sinuses. Sinuses are hollow spaces in the bones around your face. Your sinuses are located:  Around your eyes.  In the middle of your forehead.  Behind your nose.  In your cheekbones.  Your sinuses and nasal passages are lined with a stringy fluid (mucus). Mucus normally drains out of your sinuses. When your nasal tissues become inflamed or swollen, the mucus can become trapped or blocked so air cannot flow through your sinuses. This allows bacteria, viruses, and funguses to grow, which leads to infection. Sinusitis can develop quickly and last for 7?10 days (acute) or for more than 12 weeks (chronic). Sinusitis often develops after a cold. What are the causes? This condition is caused by anything that creates swelling in the sinuses or stops mucus from draining, including:  Allergies.  Asthma.  Bacterial or viral infection.  Abnormally shaped bones between the nasal passages.  Nasal growths that contain mucus (nasal polyps).  Narrow sinus openings.  Pollutants, such as chemicals or irritants in the air.  A foreign object stuck in the nose.  A fungal infection. This is rare.  What increases the risk? The following factors may make you more likely to develop this condition:  Having allergies or asthma.  Having had a recent cold or respiratory tract infection.  Having structural deformities or blockages in your nose or sinuses.  Having a weak immune system.  Doing a lot of swimming or diving.  Overusing nasal sprays.  Smoking.  What are the signs or symptoms? The main symptoms of this condition are pain and a feeling of pressure around the  affected sinuses. Other symptoms include:  Upper toothache.  Earache.  Headache.  Bad breath.  Decreased sense of smell and taste.  A cough that may get worse at night.  Fatigue.  Fever.  Thick drainage from your nose. The drainage is often green and it may contain pus (purulent).  Stuffy nose or congestion.  Postnasal drip. This is when extra mucus collects in the throat or back of the nose.  Swelling and warmth over the affected sinuses.  Sore throat.  Sensitivity to light.  How is this diagnosed? This condition is diagnosed based on symptoms, a medical history, and a physical exam. To find out if your condition is acute or chronic, your health care provider may:  Look in your nose for signs of nasal polyps.  Tap over the affected sinus to check for signs of infection.  View the inside of your sinuses using an imaging device that has a light attached (endoscope).  If your health care provider suspects that you have chronic sinusitis, you may also:  Be tested for allergies.  Have a sample of mucus taken from your nose (nasal culture) and checked for bacteria.  Have a mucus sample examined to see if your sinusitis is related to an allergy.  If your sinusitis does not respond to treatment and it lasts longer than 8 weeks, you may have an MRI or CT scan to check your sinuses. These scans also help to determine how severe your infection is. In rare cases, a bone biopsy may be done to rule out more serious types of  fungal sinus disease. How is this treated? Treatment for sinusitis depends on the cause and whether your condition is chronic or acute. If a virus is causing your sinusitis, your symptoms will go away on their own within 10 days. You may be given medicines to relieve your symptoms, including:  Topical nasal decongestants. They shrink swollen nasal passages and let mucus drain from your sinuses.  Antihistamines. These drugs block inflammation that is  triggered by allergies. This can help to ease swelling in your nose and sinuses.  Topical nasal corticosteroids. These are nasal sprays that ease inflammation and swelling in your nose and sinuses.  Nasal saline washes. These rinses can help to get rid of thick mucus in your nose.  If your condition is caused by bacteria, you will be given an antibiotic medicine. If your condition is caused by a fungus, you will be given an antifungal medicine. Surgery may be needed to correct underlying conditions, such as narrow nasal passages. Surgery may also be needed to remove polyps. Follow these instructions at home: Medicines  Take, use, or apply over-the-counter and prescription medicines only as told by your health care provider. These may include nasal sprays.  If you were prescribed an antibiotic medicine, take it as told by your health care provider. Do not stop taking the antibiotic even if you start to feel better. Hydrate and Humidify  Drink enough water to keep your urine clear or pale yellow. Staying hydrated will help to thin your mucus.  Use a cool mist humidifier to keep the humidity level in your home above 50%.  Inhale steam for 10-15 minutes, 3-4 times a day or as told by your health care provider. You can do this in the bathroom while a hot shower is running.  Limit your exposure to cool or dry air. Rest  Rest as much as possible.  Sleep with your head raised (elevated).  Make sure to get enough sleep each night. General instructions  Apply a warm, moist washcloth to your face 3-4 times a day or as told by your health care provider. This will help with discomfort.  Wash your hands often with soap and water to reduce your exposure to viruses and other germs. If soap and water are not available, use hand sanitizer.  Do not smoke. Avoid being around people who are smoking (secondhand smoke).  Keep all follow-up visits as told by your health care provider. This is  important. Contact a health care provider if:  You have a fever.  Your symptoms get worse.  Your symptoms do not improve within 10 days. Get help right away if:  You have a severe headache.  You have persistent vomiting.  You have pain or swelling around your face or eyes.  You have vision problems.  You develop confusion.  Your neck is stiff.  You have trouble breathing. This information is not intended to replace advice given to you by your health care provider. Make sure you discuss any questions you have with your health care provider. Document Released: 10/08/2005 Document Revised: 06/03/2016 Document Reviewed: 08/03/2015 Elsevier Interactive Patient Education  2017 [employer].

## 2017-06-06 NOTE — Progress Notes (Signed)
Eric Lynch is a 36 y.o. male [name] today for cough congestion and runny nose fevers and chills. Symptoms present for 5 or 6 days now. Patient has worsened recently. He is taking over-the-counter medications which do help. No trouble breathing wheezing or shortness of breath. No chest pain or palpitations.   Past Medical History:  Diagnosis Date  . Allergy   . Thyroid disease   . Vitamin D deficiency    Past Surgical History:  Procedure Laterality Date  . NO PAST SURGERIES     Social History  Substance Use Topics  . Smoking status: Never Smoker  . Smokeless tobacco: Never Used  . Alcohol use No   family history includes Alcohol abuse in his maternal grandfather and maternal uncle; Cancer in his father; Diabetes in his father; Heart attack in his maternal grandmother; Heart failure in his father; Hyperlipidemia in his father; Hypertension in his father; Stroke in his maternal grandmother.  ROS as above:  Medications: Current Outpatient Prescriptions  Medication Sig Dispense Refill  . acyclovir (ZOVIRAX) 800 MG tablet TAKE 1 TABLET BY MOUTH 2 TIMES DAILY. 60 tablet 6  . levothyroxine (SYNTHROID, LEVOTHROID) 88 MCG tablet Take 1 tablet (88 mcg total) by mouth daily. 30 tablet 3  . azithromycin (ZITHROMAX) 250 MG tablet Take 1 tablet (250 mg total) by mouth daily. Take first 2 tablets together, then 1 every day until finished. 6 tablet 0  . benzonatate (TESSALON) 200 MG capsule Take 1 capsule (200 mg total) by mouth 3 (three) times daily as needed for cough. 45 capsule 0   No current facility-administered medications for this visit.    Allergies  Allergen Reactions  . Prednisone     Health Maintenance Health Maintenance  Topic Date Due  . TETANUS/TDAP  08/20/1999  . INFLUENZA VACCINE  05/22/2017  . HIV Screening  Completed      Exam:  BP 127/69   Pulse (!) 102   Temp 98.3 F (36.8 C) (Oral)   Wt 218 lb (98.9 kg)   SpO2 99%   BMI 32.19 kg/m  Gen: Well NAD HEENT: EOMI,  MMM Clear nasal discharge with inflamed nasal turbinates bilaterally. Posterior pharynx with cobblestoning. Mild cervical lymphadenopathy present bilaterally. Mildly tender to palpation bilateral axillary sinuses. Lungs: Normal work of breathing. CTABL Heart: RRR no MRG Abd: NABS, Soft. Nondistended, Nontender Exts: Brisk capillary refill, warm and well perfused.    No results found for this or any previous visit (from the past 72 hour(s)). No results found.    Assessment and Plan: 36 y.o. male with sinusitis versus bronchitis with secondary sickening. Patient is failing over-the-counter conservative management. Plan to treat with azithromycin antibiotics. Additionally we'll use prescription Tessalon Perles as well as over-the-counter medication for symptom control. Recheck if not improving. Work note provided.   No orders of the defined types were placed in this encounter.  Meds ordered this encounter  Medications  . azithromycin (ZITHROMAX) 250 MG tablet    Sig: Take 1 tablet (250 mg total) by mouth daily. Take first 2 tablets together, then 1 every day until finished.    Dispense:  6 tablet    Refill:  0  . benzonatate (TESSALON) 200 MG capsule    Sig: Take 1 capsule (200 mg total) by mouth 3 (three) times daily as needed for cough.    Dispense:  45 capsule    Refill:  0  Discussed warning signs or symptoms. Please see discharge instructions. Patient expresses understanding.

## 2017-06-14 ENCOUNTER — Other Ambulatory Visit: Payer: Self-pay | Admitting: Sports Medicine

## 2017-06-14 DIAGNOSIS — E039 Hypothyroidism, unspecified: Secondary | ICD-10-CM

## 2017-06-14 MED FILL — LEVOTHYROXINE 88 MCG TABLET: 88 | 30 days supply | Qty: 30 | Fill #0

## 2017-06-27 MED FILL — ACYCLOVIR 800 MG TABLET: 800 | 30 days supply | Qty: 60 | Fill #4

## 2017-07-16 MED FILL — LEVOTHYROXINE 88 MCG TABLET: 88 | 30 days supply | Qty: 30 | Fill #1

## 2017-08-14 MED FILL — LEVOTHYROXINE 88 MCG TABLET: 88 | 30 days supply | Qty: 30 | Fill #2

## 2017-08-14 MED FILL — ACYCLOVIR 800 MG TABLET: 800 | 30 days supply | Qty: 60 | Fill #5

## 2017-09-09 MED FILL — LEVOTHYROXINE 88 MCG TABLET: 88 | 30 days supply | Qty: 30 | Fill #3

## 2017-10-09 ENCOUNTER — Other Ambulatory Visit: Payer: Self-pay | Admitting: Sports Medicine

## 2017-10-09 DIAGNOSIS — E039 Hypothyroidism, unspecified: Secondary | ICD-10-CM

## 2017-10-09 MED FILL — ACYCLOVIR 800 MG TABLET: 800 | 30 days supply | Qty: 60 | Fill #6

## 2017-10-09 MED FILL — LEVOTHYROXINE 88 MCG TABLET: 88 | 30 days supply | Qty: 30 | Fill #0

## 2017-10-21 ENCOUNTER — Ambulatory Visit (INDEPENDENT_AMBULATORY_CARE_PROVIDER_SITE_OTHER): Payer: BLUE CROSS/BLUE SHIELD | Admitting: Physician Assistant

## 2017-10-21 ENCOUNTER — Encounter: Payer: Self-pay | Admitting: Physician Assistant

## 2017-10-21 VITALS — BP 122/83 | HR 91 | Temp 98.0°F | Wt 223.8 lb

## 2017-10-21 DIAGNOSIS — Z8709 Personal history of other diseases of the respiratory system: Secondary | ICD-10-CM

## 2017-10-21 DIAGNOSIS — J019 Acute sinusitis, unspecified: Secondary | ICD-10-CM | POA: Diagnosis not present

## 2017-10-21 MED ORDER — PHENYLEPHRINE HCL 10 MG PO TABS: 10.0000 mg | ORAL_TABLET | Freq: Three times a day (TID) | ORAL | 0 refills | Status: DC

## 2017-10-21 MED ORDER — CEFUROXIME AXETIL 250 MG PO TABS: 250.0000 mg | ORAL_TABLET | Freq: Two times a day (BID) | ORAL | 0 refills | Status: AC

## 2017-10-21 MED ORDER — IPRATROPIUM BROMIDE 0.06 % NA SOLN: 1.0000 | Freq: Four times a day (QID) | NASAL | 0 refills | Status: DC | PRN

## 2017-10-21 NOTE — Progress Notes (Signed)
HPI:                                                                Eric Lynch is a 36 y.o. male who [facility] to [facility]: Primary Care Sports Medicine today for sinus congestion  Sinus Problem  This is a new problem. The current episode started in the past 7 days (x 5 days). The problem has been gradually worsening since onset. There has been no fever. He is experiencing no pain. Associated symptoms include congestion and sinus pressure. Pertinent negatives include no coughing, headaches, neck pain, shortness of breath or sore throat. (Yellow-green nasal discharge) Treatments tried: Mucinex. The treatment provided no relief.  Patient reports he gets two sinus infections per year that require antibiotics. He is concerned by this and wants to know if there is anything that can be done to prevent it. He is wondering if a tonsillectomy will help.  Past Medical History:  Diagnosis Date  . Allergy   . Thyroid disease   . Vitamin D deficiency    Past Surgical History:  Procedure Laterality Date  . NO PAST SURGERIES     Social History   Tobacco Use  . Smoking status: Never Smoker  . Smokeless tobacco: Never Used  Substance Use Topics  . Alcohol use: No   family history includes Alcohol abuse in his maternal grandfather and maternal uncle; Cancer in his father; Diabetes in his father; Heart attack in his maternal grandmother; Heart failure in his father; Hyperlipidemia in his father; Hypertension in his father; Stroke in his maternal grandmother.  ROS: negative except as noted in the HPI  Medications: Current Outpatient Medications  Medication Sig Dispense Refill  . acyclovir (ZOVIRAX) 800 MG tablet TAKE 1 TABLET BY MOUTH 2 TIMES DAILY. 60 tablet 6  . levothyroxine (SYNTHROID, LEVOTHROID) 88 MCG tablet TAKE 1 TABLET (88 MCG TOTAL) BY MOUTH DAILY. 30 tablet 3  . cefUROXime (CEFTIN) 250 MG tablet Take 1 tablet (250 mg total) by mouth 2 (two) times daily with a  meal for 10 days. 20 tablet 0  . ipratropium (ATROVENT) 0.06 % nasal spray Place 1 spray into both nostrils 4 (four) times daily as needed. 15 mL 0  . phenylephrine (SUDAFED PE) 10 MG TABS tablet Take 1 tablet (10 mg total) by mouth every 8 (eight) hours. 30 tablet 0   No current facility-administered medications for this visit.    Allergies  Allergen Reactions  . Prednisone        Objective:  BP 122/83   Pulse 91   Temp 98 F (36.7 C) (Oral)   Wt 223 lb 12.8 oz (101.5 kg)   BMI 33.05 kg/m  Gen:  alert, not ill-appearing, no distress, appropriate for age HEENT: head normocephalic without obvious abnormality, conjunctiva and cornea clear, nasal mucosa edematous, no frontal or maxillary sinus tenderness, no cervical adenopathy, oropharynx clear, tonsils grade 1, trachea midline Pulm: Normal work of breathing, normal phonation, clear to auscultation bilaterally, no wheezes, rales or rhonchi CV: Normal rate, regular rhythm, s1 and s2 distinct, no murmurs, clicks or rubs  Neuro: alert and oriented x 3, no tremor MSK: extremities atraumatic, normal gait and station Skin: intact, no rashes on exposed skin, no jaundice, no cyanosis Psych: well-groomed,  cooperative, good eye contact, euthymic mood, affect mood-congruent, speech is articulate, and thought processes clear and goal-directed  No flowsheet data found.   No results found for this or any previous visit (from the past 72 hour(s)). No results found.    Assessment and Plan: 36 y.o. male with   1. Acute non-recurrent sinusitis, unspecified location - encouraged symptomatic management and reserve antibiotic for symptoms beyond 10 days - cefUROXime (CEFTIN) 250 MG tablet; Take 1 tablet (250 mg total) by mouth 2 (two) times daily with a meal for 10 days.  Dispense: 20 tablet; Refill: 0 - ipratropium (ATROVENT) 0.06 % nasal spray; Place 1 spray into both nostrils 4 (four) times daily as needed.  Dispense: 15 mL; Refill: 0 -  phenylephrine (SUDAFED PE) 10 MG TABS tablet; Take 1 tablet (10 mg total) by mouth every 8 (eight) hours.  Dispense: 30 tablet; Refill: 0  2. History of acute bacterial sinusitis - discussed with patient that it is normal for a healthy adult to have up to 4 colds/URI per year. He does not have chronic sinusitis. Furthermore, his tonsils are not hypertrophied, <5 episodes of tonsillitis per year, no history of peritonsillar abscess. Patient would like to get a second opinion from an ENT - Ambulatory referral to ENT   Patient education and anticipatory guidance given Patient agrees with treatment plan Follow-up as needed if symptoms worsen or fail to improve  Levonne Hubertharley E. Selig [place]ampole PA-C

## 2017-10-21 NOTE — Patient Instructions (Signed)
- use Atrovent nasal spray up to 4 times daily for nasal congestion/runny nose - use saline nasal spray / netty pot / irrigator to clean out sinus passages - sudafed as needed for sinus pressure and congestion - stop Mucinex - this is an expectorant for chest congestion/cough - If not improved after 10 days of symptoms or you develop dental pain/facial pain/fever okay to fill antibiotic   Sinusitis, Adult Sinusitis is soreness and inflammation of your sinuses. Sinuses are hollow spaces in the bones around your face. Your sinuses are located:  Around your eyes.  In the middle of your forehead.  Behind your nose.  In your cheekbones.  Your sinuses and nasal passages are lined with a stringy fluid (mucus). Mucus normally drains out of your sinuses. When your nasal tissues become inflamed or swollen, the mucus can become trapped or blocked so air cannot flow through your sinuses. This allows bacteria, viruses, and funguses to grow, which leads to infection. Sinusitis can develop quickly and last for 7?10 days (acute) or for more than 12 weeks (chronic). Sinusitis often develops after a cold. What are the causes? This condition is caused by anything that creates swelling in the sinuses or stops mucus from draining, including:  Allergies.  Asthma.  Bacterial or viral infection.  Abnormally shaped bones between the nasal passages.  Nasal growths that contain mucus (nasal polyps).  Narrow sinus openings.  Pollutants, such as chemicals or irritants in the air.  A foreign object stuck in the nose.  A fungal infection. This is rare.  What increases the risk? The following factors may make you more likely to develop this condition:  Having allergies or asthma.  Having had a recent cold or respiratory tract infection.  Having structural deformities or blockages in your nose or sinuses.  Having a weak immune system.  Doing a lot of swimming or diving.  Overusing nasal  sprays.  Smoking.  What are the signs or symptoms? The main symptoms of this condition are pain and a feeling of pressure around the affected sinuses. Other symptoms include:  Upper toothache.  Earache.  Headache.  Bad breath.  Decreased sense of smell and taste.  A cough that may get worse at night.  Fatigue.  Fever.  Thick drainage from your nose. The drainage is often green and it may contain pus (purulent).  Stuffy nose or congestion.  Postnasal drip. This is when extra mucus collects in the throat or back of the nose.  Swelling and warmth over the affected sinuses.  Sore throat.  Sensitivity to light.  How is this diagnosed? This condition is diagnosed based on symptoms, a medical history, and a physical exam. To find out if your condition is acute or chronic, your health care provider may:  Look in your nose for signs of nasal polyps.  Tap over the affected sinus to check for signs of infection.  View the inside of your sinuses using an imaging device that has a light attached (endoscope).  If your health care provider suspects that you have chronic sinusitis, you may also:  Be tested for allergies.  Have a sample of mucus taken from your nose (nasal culture) and checked for bacteria.  Have a mucus sample examined to see if your sinusitis is related to an allergy.  If your sinusitis does not respond to treatment and it lasts longer than 8 weeks, you may have an MRI or CT scan to check your sinuses. These scans also help to determine  how severe your infection is. In rare cases, a bone biopsy may be done to rule out more serious types of fungal sinus disease. How is this treated? Treatment for sinusitis depends on the cause and whether your condition is chronic or acute. If a virus is causing your sinusitis, your symptoms will go away on their own within 10 days. You may be given medicines to relieve your symptoms, including:  Topical nasal decongestants.  They shrink swollen nasal passages and let mucus drain from your sinuses.  Antihistamines. These drugs block inflammation that is triggered by allergies. This can help to ease swelling in your nose and sinuses.  Topical nasal corticosteroids. These are nasal sprays that ease inflammation and swelling in your nose and sinuses.  Nasal saline washes. These rinses can help to get rid of thick mucus in your nose.  If your condition is caused by bacteria, you will be given an antibiotic medicine. If your condition is caused by a fungus, you will be given an antifungal medicine. Surgery may be needed to correct underlying conditions, such as narrow nasal passages. Surgery may also be needed to remove polyps. Follow these instructions at home: Medicines  Take, use, or apply over-the-counter and prescription medicines only as told by your health care provider. These may include nasal sprays.  If you were prescribed an antibiotic medicine, take it as told by your health care provider. Do not stop taking the antibiotic even if you start to feel better. Hydrate and Humidify  Drink enough water to keep your urine clear or pale yellow. Staying hydrated will help to thin your mucus.  Use a cool mist humidifier to keep the humidity level in your home above 50%.  Inhale steam for 10-15 minutes, 3-4 times a day or as told by your health care provider. You can do this in the bathroom while a hot shower is running.  Limit your exposure to cool or dry air. Rest  Rest as much as possible.  Sleep with your head raised (elevated).  Make sure to get enough sleep each night. General instructions  Apply a warm, moist washcloth to your face 3-4 times a day or as told by your health care provider. This will help with discomfort.  Wash your hands often with soap and water to reduce your exposure to viruses and other germs. If soap and water are not available, use hand sanitizer.  Do not smoke. Avoid being  around people who are smoking (secondhand smoke).  Keep all follow-up visits as told by your health care provider. This is important. Contact a health care provider if:  You have a fever.  Your symptoms get worse.  Your symptoms do not improve within 10 days. Get help right away if:  You have a severe headache.  You have persistent vomiting.  You have pain or swelling around your face or eyes.  You have vision problems.  You develop confusion.  Your neck is stiff.  You have trouble breathing. This information is not intended to replace advice given to you by your health care provider. Make sure you discuss any questions you have with your health care provider. Document Released: 10/08/2005 Document Revised: 06/03/2016 Document Reviewed: 08/03/2015 Elsevier Interactive Patient Education  [employer].

## 2017-11-14 MED FILL — LEVOTHYROXINE 88 MCG TABLET: 88 | 30 days supply | Qty: 30 | Fill #1

## 2017-11-28 ENCOUNTER — Other Ambulatory Visit: Payer: Self-pay | Admitting: Sports Medicine

## 2017-11-28 MED FILL — ACYCLOVIR 800 MG TABLET: 800 | 30 days supply | Qty: 60 | Fill #0

## 2017-12-11 MED FILL — LEVOTHYROXINE 88 MCG TABLET: 88 | 30 days supply | Qty: 30 | Fill #2

## 2018-01-14 MED FILL — ACYCLOVIR 800 MG TABLET: 800 | 30 days supply | Qty: 60 | Fill #1

## 2018-01-14 MED FILL — LEVOTHYROXINE 88 MCG TABLET: 88 | 30 days supply | Qty: 30 | Fill #3

## 2018-02-13 ENCOUNTER — Other Ambulatory Visit: Payer: Self-pay | Admitting: Sports Medicine

## 2018-02-13 DIAGNOSIS — E039 Hypothyroidism, unspecified: Secondary | ICD-10-CM

## 2018-02-13 MED FILL — LEVOTHYROXINE 88 MCG TABLET: 88 | 30 days supply | Qty: 30 | Fill #0

## 2018-02-20 ENCOUNTER — Encounter: Payer: Self-pay | Admitting: Family Medicine

## 2018-02-20 ENCOUNTER — Ambulatory Visit: Payer: BLUE CROSS/BLUE SHIELD | Admitting: Family Medicine

## 2018-02-20 VITALS — BP 102/70 | HR 72 | Temp 98.2°F | Ht 72.0 in | Wt 221.0 lb

## 2018-02-20 DIAGNOSIS — J0100 Acute maxillary sinusitis, unspecified: Secondary | ICD-10-CM | POA: Diagnosis not present

## 2018-02-20 MED ORDER — AMOXICILLIN-POT CLAVULANATE 875-125 MG PO TABS: 1.0000 | ORAL_TABLET | Freq: Two times a day (BID) | ORAL | 0 refills | Status: DC

## 2018-02-20 MED ORDER — FLUTICASONE PROPIONATE 50 MCG/ACT NA SUSP: 2.0000 | Freq: Every day | NASAL | 6 refills | Status: DC

## 2018-02-20 NOTE — Progress Notes (Signed)
   Subjective:    Patient ID: Eric Lynch, male    DOB: 07/02/1981, 37 y.o.   MRN: [id number]  HPI 37 year old male comes in today complaining of sinus symptoms x 1 week.  It out with a scratchy throat.  Denies mostly expensing nasal congestion with yellow-colored mucus.  He has not had any fevers chills or sweats but has been using Zyrtec daily.  He is concerned because he says often times when he gets sinus symptoms that eventually goes into his chest and has had pneumonia before.  He does get about 2-3 sinus infections per year.   Review of Systems     Objective:   Physical Exam  Constitutional: He is oriented to person, place, and time. He appears well-developed and well-nourished.  HENT:  Head: Normocephalic and atraumatic.  Right Ear: External ear normal.  Left Ear: External ear normal.  Nose: Nose normal.  Mouth/Throat: Oropharynx is clear and moist.  TMs and canals are clear.   Eyes: Pupils are equal, round, and reactive to light. Conjunctivae and EOM are normal.  Neck: Neck supple. No thyromegaly present.  Cardiovascular: Normal rate and normal heart sounds.  Pulmonary/Chest: Effort normal and breath sounds normal.  Lymphadenopathy:    He has no cervical adenopathy.  Neurological: He is alert and oriented to person, place, and time.  Skin: Skin is warm and dry.  Psychiatric: He has a normal mood and affect.        Assessment & Plan:  Acute sinusitis-we will treat with Augmentin and have him do nasal saline rinse as well as adding a nasal steroid spray, especially since he has a hard time getting rid of the sinus infection once it starts.  If he is not improving or feels like he is getting worse and call us immediately.  We also discussed that we can refer him to an allergist at some point if he would like.  It sounds like a lot of his sinus infections are complicated by allergies.  Sometimes having some allergy testing and knowing exactly what he is allergic to is  helpful.  The skin to be out mowing the grass or working in the yard this week and encouraged him to wear a facemask.

## 2018-02-20 NOTE — Patient Instructions (Signed)
Sinus Rinse What is a sinus rinse? A sinus rinse is a home treatment. It rinses your sinuses with a mixture of salt and water (saline solution). Sinuses are air-filled spaces in your skull behind the bones of your face and forehead. They open into your nasal cavity. To do a sinus rinse, you will need:  Saline solution.  Neti pot or spray bottle. This releases the saline solution into your nose and through your sinuses. You can buy neti pots and spray bottles at: ? Your local pharmacy. ? A health food store. ? Online.  When should I do a sinus rinse? A sinus rinse can help to clear your nasal cavity. It can clear:  Mucus.  Dirt.  Dust.  Pollen.  You may do a sinus rinse when you have:  A cold.  A virus.  Allergies.  A sinus infection.  A stuffy nose.  If you are considering a sinus rinse:  Ask your child's doctor before doing a sinus rinse on your child.  Do not do a sinus rinse if you have had: ? Ear or nasal surgery. ? An ear infection. ? Blocked ears.  How do I do a sinus rinse?  Wash your hands.  Disinfect your device using the directions that came with the device.  Dry your device.  Use the solution that comes with your device or one that is sold separately in stores. Follow the mixing directions on the package.  Fill your device with the amount of saline solution as stated in the device instructions.  Stand over a sink and tilt your head sideways over the sink.  Place the spout of the device in your upper nostril (the one closer to the ceiling).  Gently pour or squeeze the saline solution into the nasal cavity. The liquid should drain to the lower nostril if you are not too congested.  Gently blow your nose. Blowing too hard may cause ear pain.  Repeat in the other nostril.  Clean and rinse your device with clean water.  Air-dry your device. Are there risks of a sinus rinse? Sinus rinse is normally very safe and helpful. However, there are a  few risks, which include:  A burning feeling in the sinuses. This may happen if you do not make the saline solution as instructed. Make sure to follow all directions when making the saline solution.  Infection from unclean water. This is rare, but possible.  Nasal irritation.  This information is not intended to replace advice given to you by your health care provider. Make sure you discuss any questions you have with your health care provider. Document Released: 05/05/2014 Document Revised: 09/04/2016 Document Reviewed: 02/23/2014 Elsevier Interactive Patient Education  2017 Elsevier Inc.  

## 2018-03-10 MED FILL — ACYCLOVIR 800 MG TABLET: 800 | 30 days supply | Qty: 60 | Fill #2

## 2018-03-10 MED FILL — LEVOTHYROXINE 88 MCG TABLET: 88 | 30 days supply | Qty: 30 | Fill #1

## 2018-03-31 ENCOUNTER — Encounter: Payer: Self-pay | Admitting: Physician Assistant

## 2018-03-31 ENCOUNTER — Ambulatory Visit: Payer: BLUE CROSS/BLUE SHIELD | Admitting: Physician Assistant

## 2018-03-31 VITALS — BP 121/79 | HR 88 | Temp 98.1°F | Wt 223.0 lb

## 2018-03-31 DIAGNOSIS — J020 Streptococcal pharyngitis: Secondary | ICD-10-CM

## 2018-03-31 LAB — POCT RAPID STREP A (OFFICE): RAPID STREP A SCREEN: POSITIVE — AB

## 2018-03-31 MED ORDER — PENICILLIN G BENZATHINE 1200000 UNIT/2ML IM SUSP
1.2000 10*6.[IU] | Freq: Once | INTRAMUSCULAR | Status: AC
  Administered 2018-03-31: 1.2 10*6.[IU] via INTRAMUSCULAR

## 2018-03-31 NOTE — Progress Notes (Signed)
HPI:                                                                Eric ChuteJason C Lynch is a 37 y.o. male who [facility] [name] [facility] today for URI  URI   This is a new problem. The current episode started in the past 7 days. The problem has been unchanged. The maximum temperature recorded prior to his arrival was 101 - 101.9 F (last known fever Tuesday). Associated symptoms include coughing (productive of yellow sputum) and a sore throat. Pertinent negatives include no abdominal pain, chest pain, congestion, ear pain, headaches, nausea, rhinorrhea, sinus pain or wheezing. He has tried acetaminophen and decongestant for the symptoms.  Reports son diagnosed with Strep throat and had similar symptoms    No flowsheet data found.  No flowsheet data found.    Past Medical History:  Diagnosis Date  . Allergy   . Thyroid disease   . Vitamin D deficiency    Past Surgical History:  Procedure Laterality Date  . NO PAST SURGERIES     Social History   Tobacco Use  . Smoking status: Never Smoker  . Smokeless tobacco: Never Used  Substance Use Topics  . Alcohol use: No   family history includes Alcohol abuse in his maternal grandfather and maternal uncle; Cancer in his father; Diabetes in his father; Heart attack in his maternal grandmother; Heart failure in his father; Hyperlipidemia in his father; Hypertension in his father; Stroke in his maternal grandmother.    ROS: negative except as noted in the HPI  Medications: Current Outpatient Medications  Medication Sig Dispense Refill  . acyclovir (ZOVIRAX) 800 MG tablet TAKE 1 TABLET BY MOUTH 2 TIMES DAILY. 60 tablet 6  . fluticasone (FLONASE) 50 MCG/ACT nasal spray Place 2 sprays into both nostrils daily. 16 g 6  . levothyroxine (SYNTHROID, LEVOTHROID) 88 MCG tablet TAKE 1 TABLET BY MOUTH ONCE DAILY 30 tablet 3   No current facility-administered medications for this visit.    Allergies   Allergen Reactions  . Prednisone        Objective:  BP 121/79   Pulse 88   Temp 98.1 F (36.7 C) (Oral)   Wt 223 lb (101.2 kg)   SpO2 98%   BMI 30.24 kg/m  Gen:  alert, not ill-appearing, no distress, appropriate for age, obese male HEENT: head normocephalic without obvious abnormality, conjunctiva and cornea clear, TM's pearly gray and semi-transparent, nasal mucosa pink, no sinus tenderness,  tonsils grade 2, no exudates, uvula midline, no cervical adenopathy, trachea midline Pulm: Normal work of breathing, normal phonation, clear to auscultation bilaterally, no wheezes, rales or rhonchi CV: Normal rate, regular rhythm, s1 and s2 distinct, no murmurs, clicks or rubs  Neuro: alert and oriented x 3, no tremor MSK: extremities atraumatic, normal gait and station Skin: intact, no rashes on exposed skin, no jaundice, no cyanosis Psych: well-groomed, cooperative, good eye contact, euthymic mood, affect mood-congruent, speech is articulate, and thought processes clear and goal-directed    Results for orders placed or performed in visit on 03/31/18 (from the past 72 hour(s))  POCT rapid strep A     Status: Abnormal   Collection Time: 03/31/18 10:35 AM  Result Value Ref  Range   Rapid Strep A Screen Positive (A) Negative   No results found.    Assessment and Plan: 37 y.o. male with   Streptococcal pharyngitis - Plan: POCT rapid strep A, penicillin g benzathine (BICILLIN LA) 1200000 UNIT/2ML injection 1.2 Million Units - Bicillin giving in office today for Strep pharyngitis - counseled on symptomatic care: expectorant during the day, PO hydration, cough suppressant at bedtime  Patient education and anticipatory guidance given Patient agrees with treatment plan Follow-up as needed if symptoms worsen or fail to improve  Levonne Hubert PA-C

## 2018-03-31 NOTE — Patient Instructions (Addendum)
- start Mucinex to make cough more productive - take cough suppressant (Delsym or Robitussin) at bedtime to help with rest - drink at least 1.5L of water per day    Strep Throat Strep throat is a bacterial infection of the throat. Your health care provider may call the infection tonsillitis or pharyngitis, depending on whether there is swelling in the tonsils or at the back of the throat. Strep throat is most common during the cold months of the year in children who are 18-75 years of age, but it can happen during any season in people of any age. This infection is spread from person to person (contagious) through coughing, sneezing, or close contact. What are the causes? Strep throat is caused by the bacteria called Streptococcus pyogenes. What increases the risk? This condition is more likely to develop in:  People who spend time in crowded places where the infection can spread easily.  People who have close contact with someone who has strep throat.  What are the signs or symptoms? Symptoms of this condition include:  Fever or chills.  Redness, swelling, or pain in the tonsils or throat.  Pain or difficulty when swallowing.  White or yellow spots on the tonsils or throat.  Swollen, tender glands in the neck or under the jaw.  Red rash all over the body (rare).  How is this diagnosed? This condition is diagnosed by performing a rapid strep test or by taking a swab of your throat (throat culture test). Results from a rapid strep test are usually ready in a few minutes, but throat culture test results are available after one or two days. How is this treated? This condition is treated with antibiotic medicine. Follow these instructions at home: Medicines  Take over-the-counter and prescription medicines only as told by your health care provider.  Take your antibiotic as told by your health care provider. Do not stop taking the antibiotic even if you start to feel better.  Have  family members who also have a sore throat or fever tested for strep throat. They may need antibiotics if they have the strep infection. Eating and drinking  Do not share food, drinking cups, or personal items that could cause the infection to spread to other people.  If swallowing is difficult, try eating soft foods until your sore throat feels better.  Drink enough fluid to keep your urine clear or pale yellow. General instructions  Gargle with a salt-water mixture 3-4 times per day or as needed. To make a salt-water mixture, completely dissolve -1 tsp of salt in 1 cup of warm water.  Make sure that all household members wash their hands well.  Get plenty of rest.  Stay home from school or work until you have been taking antibiotics for 24 hours.  Keep all follow-up visits as told by your health care provider. This is important. Contact a health care provider if:  The glands in your neck continue to get bigger.  You develop a rash, cough, or earache.  You cough up a thick liquid that is green, yellow-brown, or bloody.  You have pain or discomfort that does not get better with medicine.  Your problems seem to be getting worse rather than better.  You have a fever. Get help right away if:  You have new symptoms, such as vomiting, severe headache, stiff or painful neck, chest pain, or shortness of breath.  You have severe throat pain, drooling, or changes in your voice.  You have swelling  of the neck, or the skin on the neck becomes red and tender.  You have signs of dehydration, such as fatigue, dry mouth, and decreased urination.  You become increasingly sleepy, or you cannot wake up completely.  Your joints become red or painful. This information is not intended to replace advice given to you by your health care provider. Make sure you discuss any questions you have with your health care provider. Document Released: 10/05/2000 Document Revised: 06/06/2016 Document  Reviewed: 01/31/2015 Elsevier Interactive Patient Education  [employer]2018 Elsevier Inc.

## 2018-04-04 ENCOUNTER — Telehealth: Payer: Self-pay

## 2018-04-04 NOTE — Telephone Encounter (Signed)
PT still not feeling well. PT is asking for an antibiotic. Please send to [facility] pharmacy

## 2018-04-07 NOTE — Telephone Encounter (Signed)
Patient was already treated with antibiotics in the office If he is still having symptoms, he should be re-evaluated

## 2018-04-15 MED FILL — LEVOTHYROXINE 88 MCG TABLET: 88 | 30 days supply | Qty: 30 | Fill #2

## 2018-04-15 MED FILL — ACYCLOVIR 800 MG TABLET: 800 | 30 days supply | Qty: 60 | Fill #3

## 2018-05-13 MED FILL — LEVOTHYROXINE 88 MCG TABLET: 88 | 30 days supply | Qty: 30 | Fill #3

## 2018-05-29 MED FILL — ACYCLOVIR 800 MG TABLET: 800 | 30 days supply | Qty: 60 | Fill #4

## 2018-06-11 ENCOUNTER — Other Ambulatory Visit: Payer: Self-pay | Admitting: Sports Medicine

## 2018-06-11 DIAGNOSIS — E039 Hypothyroidism, unspecified: Secondary | ICD-10-CM

## 2018-06-11 MED FILL — LEVOTHYROXINE 88 MCG TABLET: 88 | 30 days supply | Qty: 30 | Fill #0

## 2018-07-10 MED FILL — ACYCLOVIR 800 MG TABLET: 800 | 30 days supply | Qty: 60 | Fill #5

## 2018-07-10 MED FILL — LEVOTHYROXINE 88 MCG TABLET: 88 | 30 days supply | Qty: 30 | Fill #1

## 2018-08-13 MED FILL — LEVOTHYROXINE 88 MCG TABLET: 88 | 30 days supply | Qty: 30 | Fill #2

## 2018-08-21 ENCOUNTER — Ambulatory Visit (INDEPENDENT_AMBULATORY_CARE_PROVIDER_SITE_OTHER): Payer: BLUE CROSS/BLUE SHIELD

## 2018-08-21 ENCOUNTER — Encounter: Payer: Self-pay | Admitting: Sports Medicine

## 2018-08-21 ENCOUNTER — Ambulatory Visit: Payer: BLUE CROSS/BLUE SHIELD | Admitting: Sports Medicine

## 2018-08-21 DIAGNOSIS — M79672 Pain in left foot: Secondary | ICD-10-CM

## 2018-08-21 DIAGNOSIS — M19079 Primary osteoarthritis, unspecified ankle and foot: Secondary | ICD-10-CM

## 2018-08-21 DIAGNOSIS — M7731 Calcaneal spur, right foot: Secondary | ICD-10-CM | POA: Diagnosis not present

## 2018-08-21 LAB — URIC ACID: Uric Acid, Serum: 6.1 mg/dL (ref 4.0–8.0)

## 2018-08-21 MED ORDER — MELOXICAM 15 MG PO TABS: ORAL_TABLET | ORAL | 3 refills | Status: DC

## 2018-08-21 NOTE — Patient Instructions (Signed)
Hallux Rigidus Hallux rigidus is a type of joint pain or joint disease (arthritis) that affects your big toe (hallux). This condition involves the joint that connects the base of your big toe to the main part of your foot (metatarsophalangeal joint). This condition can cause your big toe to become stiff, painful, and difficult to move. Symptoms may get worse with movement or in cold or damp weather. The condition also gets worse over time. What are the causes? This condition may be caused by having a foot that does not function the way that it should or has an abnormal shape (structural deformity). These foot problems can run in families (be hereditary). This condition can also be caused by:  Injury.  Overuse.  Certain inflammatory diseases, including gout and rheumatoid arthritis.  What increases the risk? This condition is more likely to develop in people who:  Have a foot bone (metatarsal) that is longer or higher than normal.  Have a family history of hallux rigidus.  Have previously injured their big toe.  Have feet that do not have a curve (arch) on the inner side of the foot. This may be called flat feet or fallen arches.  Turn their ankles in when they walk (pronation).  Have rheumatoid arthritis or gout.  Have to stoop down often at work.  What are the signs or symptoms? Symptoms of this condition include:  Big toe pain.  Stiffness and difficulty moving the big toe.  Swelling of the toe and surrounding area.  Bone spurs. These are bony growths that can form on the joint of the big toe.  A limp.  How is this diagnosed? This condition is diagnosed based on a medical history and physical exam. This may include X-rays. How is this treated? Treatment for this condition includes:  Wearing roomy, comfortable shoes that have a large toe box.  Putting orthotic devices in your shoes.  Pain medicines.  Physical therapy.  Icing the injured area.  Alternate  between putting your foot in cold water then warm water.  If your condition is severe, treatment may include:  Corticosteroid injections to relieve pain.  Surgery to remove bone spurs, fuse damaged bones together, or replace the entire joint.  Follow these instructions at home:  Take over-the-counter and prescription medicines only as told by your health care provider.  Do not wear high heels or other restrictive footwear. Wear comfortable, supportive shoes that have a large toe box.  Wear orthotics as told by your health care provider, if this applies.  Put your feet in cold water for 30 seconds, then in warm water for 30 seconds. Alternate between the cold and warm water for 5 minutes. Do this several times a day or as told by your health care provider.  If directed, apply ice to the injured area. ? Put ice in a plastic bag. ? Place a towel between your skin and the bag. ? Leave the ice on for 20 minutes, 2-3 times per day.  Do foot exercises as instructed by your health care provider or a physical therapist.  Keep all follow-up visits as told by your health care provider. This is important. Contact a health care provider if:  You notice bone spurs or growths on or around your big toe.  Your pain does not get better or it gets worse.  You have pain while resting.  You have pain in other parts of your body, such as your back, hip, or knee.  You start to limp.   This information is not intended to replace advice given to you by your health care provider. Make sure you discuss any questions you have with your health care provider. Document Released: 10/08/2005 Document Revised: 03/15/2016 Document Reviewed: 06/15/2015 Elsevier Interactive Patient Education  2018 Elsevier Inc.  

## 2018-08-21 NOTE — Progress Notes (Signed)
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  It is never been hot and red and swollen.  Symptoms are moderate, persistent, although much better today.  I reviewed the past medical history, family history, social history, surgical history, and allergies today and no changes were needed.  Please see the problem list section below in epic for further details.  Past Medical History: Past Medical History:  Diagnosis Date  . Allergy   . Thyroid disease   . Vitamin D deficiency    Past Surgical History: Past Surgical History:  Procedure Laterality Date  . NO PAST SURGERIES     Social History: Social History   Socioeconomic History  . Marital status: Married    Spouse name: Not on file  . Number of children: Not on file  . Years of education: Not on file  . Highest education level: Not on file  Occupational History  . Not on file  Social Needs  . Financial resource strain: Not on file  . Food insecurity:    Worry: Not on file    Inability: Not on file  . Transportation needs:    Medical: Not on file    Non-medical: Not on file  Tobacco Use  . Smoking status: Never Smoker  . Smokeless tobacco: Never Used  Substance and Sexual Activity  . Alcohol use: No  . Drug use: No  . Sexual activity: Not on file  Lifestyle  . Physical activity:    Days per week: Not on file    Minutes per session: Not on file  . Stress: Not on file  Relationships  . Social connections:    Talks on phone: Not on file    Gets together: Not on file    Attends religious service: Not on file    Active member of club or organization: Not on file    Attends meetings of clubs or organizations: Not on file    Relationship status: Not on file  Other Topics Concern  . Not on file  Social History Narrative  . Not  on file   Family History: Family History  Problem Relation Age of Onset  . Hypertension Father   . Diabetes Father   . Heart failure Father   . Cancer Father        BLADDER  . Hyperlipidemia Father   . Alcohol abuse Maternal Uncle   . Stroke Maternal Grandmother   . Heart attack Maternal Grandmother   . Alcohol abuse Maternal Grandfather    Allergies: Allergies  Allergen Reactions  . Prednisone    Medications: See med rec.  Review of Systems: No fevers, chills, night sweats, weight loss, chest pain, or shortness of breath.   Objective:    General: Well Developed, well nourished, and in no acute distress.  Neuro: Alert and oriented x3, extra-ocular muscles intact, sensation grossly intact.  HEENT: Normocephalic, atraumatic, pupils equal round reactive to light, neck supple, no masses, no lymphadenopathy, thyroid nonpalpable.  Skin: Warm and dry, no rashes. Cardiac: Regular rate and rhythm, no murmurs rubs or gallops, no lower extremity edema.  Respiratory: Clear to auscultation bilaterally. Not using accessory muscles, speaking in full sentences. Bilateral feet: No visible erythema or swelling. Range of motion is full in all directions. Strength is 5/5 in all directions. No hallux valgus. No  pes cavus or pes planus. No abnormal callus noted. No pain over the navicular prominence, or base of fifth metatarsal. No tenderness to palpation of the calcaneal insertion of plantar fascia. No pain at the Achilles insertion. No pain over the calcaneal bursa. No pain of the retrocalcaneal bursa. No tenderness to palpation over the tarsals, metatarsals, or phalanges. No hallux rigidus or limitus. No tenderness palpation over interphalangeal joints. No pain with compression of the metatarsal heads. Neurovascularly intact distally. Mild bilateral hallux limitus without hallux rigidus, no first MTP swelling, erythema.  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 Bilateral x-rays, meloxicam, return for custom molded orthotics. I am going to check serum uric acid levels. ___________________________________________ Ihor Austin. [name], M.D., ABFM., CAQSM. Primary Care and Sports Medicine [facility]  Adjunct Professor of Family Medicine  [employer]

## 2018-08-21 NOTE — Assessment & Plan Note (Addendum)
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 He does not quite have hallux rigidus. Bilateral x-rays, meloxicam, return for custom molded orthotics. I am going to check serum uric acid levels.

## 2018-09-10 MED FILL — LEVOTHYROXINE 88 MCG TABLET: 88 | 30 days supply | Qty: 30 | Fill #3

## 2018-09-11 MED FILL — ACYCLOVIR 800 MG TABLET: 800 | 30 days supply | Qty: 60 | Fill #6

## 2018-10-10 ENCOUNTER — Other Ambulatory Visit: Payer: Self-pay | Admitting: Sports Medicine

## 2018-10-10 DIAGNOSIS — E039 Hypothyroidism, unspecified: Secondary | ICD-10-CM

## 2018-10-10 MED FILL — LEVOTHYROXINE 88 MCG TABLET: 88 | 90 days supply | Qty: 90 | Fill #0

## 2018-10-23 ENCOUNTER — Other Ambulatory Visit: Payer: Self-pay | Admitting: Sports Medicine

## 2018-10-23 MED FILL — ACYCLOVIR 800 MG TABLET: 800 | 30 days supply | Qty: 60 | Fill #0

## 2018-12-17 MED FILL — ACYCLOVIR 800 MG TABLET: 800 | 30 days supply | Qty: 60 | Fill #1

## 2019-01-06 ENCOUNTER — Other Ambulatory Visit: Payer: Self-pay | Admitting: Sports Medicine

## 2019-01-06 DIAGNOSIS — E039 Hypothyroidism, unspecified: Secondary | ICD-10-CM

## 2019-01-06 MED FILL — LEVOTHYROXINE 88 MCG TABLET: 88 | 90 days supply | Qty: 90 | Fill #0

## 2019-01-29 MED FILL — ACYCLOVIR 800 MG TABLET: 800 | 30 days supply | Qty: 60 | Fill #2

## 2019-03-26 MED FILL — ACYCLOVIR 800 MG TABLET: 800 | 30 days supply | Qty: 60 | Fill #3

## 2019-04-07 ENCOUNTER — Other Ambulatory Visit: Payer: Self-pay | Admitting: Sports Medicine

## 2019-04-07 DIAGNOSIS — E039 Hypothyroidism, unspecified: Secondary | ICD-10-CM

## 2019-04-07 MED ORDER — LEVOTHYROXINE SODIUM 88 MCG PO TABS: 88.0000 ug | ORAL_TABLET | Freq: Every day | ORAL | 0 refills | Status: DC

## 2019-04-07 MED FILL — LEVOTHYROXINE 88 MCG TABLET: 88 | 90 days supply | Qty: 90 | Fill #0

## 2019-04-08 ENCOUNTER — Telehealth: Payer: Self-pay | Admitting: Sports Medicine

## 2019-04-08 NOTE — Telephone Encounter (Signed)
I did on the 16th.

## 2019-04-09 NOTE — Telephone Encounter (Signed)
Pt advised.

## 2019-05-14 MED FILL — ACYCLOVIR 800 MG TABLET: 800 | 30 days supply | Qty: 60 | Fill #4

## 2019-06-30 ENCOUNTER — Other Ambulatory Visit: Payer: Self-pay | Admitting: Sports Medicine

## 2019-06-30 DIAGNOSIS — E039 Hypothyroidism, unspecified: Secondary | ICD-10-CM

## 2019-06-30 MED FILL — LEVOTHYROXINE 88 MCG TABLET: 88 | 90 days supply | Qty: 90 | Fill #0

## 2019-07-06 MED FILL — ACYCLOVIR 800 MG TABLET: 800 | 30 days supply | Qty: 60 | Fill #5

## 2019-07-24 DIAGNOSIS — Z23 Encounter for immunization: Secondary | ICD-10-CM | POA: Diagnosis not present

## 2019-08-31 MED FILL — ACYCLOVIR 800 MG TABLET: 800 | 30 days supply | Qty: 60 | Fill #6

## 2019-09-29 ENCOUNTER — Other Ambulatory Visit: Payer: Self-pay | Admitting: Sports Medicine

## 2019-09-29 ENCOUNTER — Encounter: Payer: Self-pay | Admitting: Sports Medicine

## 2019-09-29 DIAGNOSIS — E039 Hypothyroidism, unspecified: Secondary | ICD-10-CM

## 2019-10-07 ENCOUNTER — Other Ambulatory Visit: Payer: Self-pay

## 2019-10-07 ENCOUNTER — Encounter: Payer: Self-pay | Admitting: Sports Medicine

## 2019-10-07 ENCOUNTER — Ambulatory Visit: Payer: BLUE CROSS/BLUE SHIELD | Admitting: Sports Medicine

## 2019-10-07 DIAGNOSIS — M19079 Primary osteoarthritis, unspecified ankle and foot: Secondary | ICD-10-CM | POA: Diagnosis not present

## 2019-10-07 DIAGNOSIS — E039 Hypothyroidism, unspecified: Secondary | ICD-10-CM

## 2019-10-07 MED ORDER — ACYCLOVIR 800 MG PO TABS: 800.0000 mg | ORAL_TABLET | Freq: Two times a day (BID) | ORAL | 6 refills | Status: DC

## 2019-10-07 MED ORDER — MELOXICAM 15 MG PO TABS: ORAL_TABLET | ORAL | 3 refills | Status: DC

## 2019-10-07 MED ORDER — LEVOTHYROXINE SODIUM 88 MCG PO TABS: 88.0000 ug | ORAL_TABLET | Freq: Every day | ORAL | 3 refills | Status: DC

## 2019-10-07 MED FILL — LEVOTHYROXINE 88 MCG TABLET: 88 | 90 days supply | Qty: 90 | Fill #0

## 2019-10-07 MED FILL — MELOXICAM 15 MG TABLET: 15 | 90 days supply | Qty: 90 | Fill #0

## 2019-10-07 MED FILL — ACYCLOVIR 800 MG TABLET: 800 | 30 days supply | Qty: 60 | Fill #0

## 2019-10-07 NOTE — Progress Notes (Signed)
Subjective:    CC: Follow-up  HPI: Eric Lynch returns, he needs refills on his medications, he is stable.  Labs look good, he gets them every year with the department.  I reviewed the past medical history, family history, social history, surgical history, and allergies today and no changes were needed.  Please see the problem list section below in epic for further details.  Past Medical History: Past Medical History:  Diagnosis Date  . Allergy   . Thyroid disease   . Vitamin D deficiency    Past Surgical History: Past Surgical History:  Procedure Laterality Date  . NO PAST SURGERIES     Social History: Social History   Socioeconomic History  . Marital status: Married    Spouse name: Not on file  . Number of children: Not on file  . Years of education: Not on file  . Highest education level: Not on file  Occupational History  . Not on file  Tobacco Use  . Smoking status: Never Smoker  . Smokeless tobacco: Never Used  Substance and Sexual Activity  . Alcohol use: No  . Drug use: No  . Sexual activity: Not on file  Other Topics Concern  . Not on file  Social History Narrative  . Not on file   Social Determinants of Health   Financial Resource Strain:   . Difficulty of Paying Living Expenses: Not on file  Food Insecurity:   . Worried About Charity fundraiser in the Last Year: Not on file  . Ran Out of Food in the Last Year: Not on file  Transportation Needs:   . Lack of Transportation (Medical): Not on file  . Lack of Transportation (Non-Medical): Not on file  Physical Activity:   . Days of Exercise per Week: Not on file  . Minutes of Exercise per Session: Not on file  Stress:   . Feeling of Stress : Not on file  Social Connections:   . Frequency of Communication with Friends and Family: Not on file  . Frequency of Social Gatherings with Friends and Family: Not on file  . Attends Religious Services: Not on file  . Active Member of Clubs or Organizations: Not  on file  . Attends Archivist Meetings: Not on file  . Marital Status: Not on file   Family History: Family History  Problem Relation Age of Onset  . Hypertension Father   . Diabetes Father   . Heart failure Father   . Cancer Father        BLADDER  . Hyperlipidemia Father   . Alcohol abuse Maternal Uncle   . Stroke Maternal Grandmother   . Heart attack Maternal Grandmother   . Alcohol abuse Maternal Grandfather    Allergies: Allergies  Allergen Reactions  . Prednisone    Medications: See med rec.  Review of Systems: No fevers, chills, night sweats, weight loss, chest pain, or shortness of breath.   Objective:    General: Well Developed, well nourished, and in no acute distress.  Neuro: Alert and oriented x3, extra-ocular muscles intact, sensation grossly intact.  HEENT: Normocephalic, atraumatic, pupils equal round reactive to light, neck supple, no masses, no lymphadenopathy, thyroid nonpalpable.  Skin: Warm and dry, no rashes. Cardiac: Regular rate and rhythm, no murmurs rubs or gallops, no lower extremity edema.  Respiratory: Clear to auscultation bilaterally. Not using accessory muscles, speaking in full sentences.  Impression and Recommendations:    Hypothyroidism TSH was normal in January of this year,  refilling medication.   ___________________________________________ Ihor Austin. [name], M.D., ABFM., CAQSM. Primary Care and Sports Medicine [place] [facility]  Adjunct Professor of Family Medicine  [employer]

## 2019-10-07 NOTE — Assessment & Plan Note (Signed)
TSH was normal in January of this year, refilling medication.

## 2019-10-27 DIAGNOSIS — Z03818 Encounter for observation for suspected exposure to other biological agents ruled out: Secondary | ICD-10-CM | POA: Diagnosis not present

## 2019-12-16 MED FILL — ACYCLOVIR 800 MG TABLET: 800 | 30 days supply | Qty: 60 | Fill #1

## 2020-01-05 MED FILL — LEVOTHYROXINE 88 MCG TABLET: 88 | 90 days supply | Qty: 90 | Fill #1

## 2020-02-02 MED FILL — ACYCLOVIR 800 MG TABLET: 800 | 30 days supply | Qty: 60 | Fill #2

## 2020-03-24 MED FILL — ACYCLOVIR 800 MG TABLET: 800 | 30 days supply | Qty: 60 | Fill #3

## 2020-03-24 MED FILL — LEVOTHYROXINE 88 MCG TABLET: 88 | 90 days supply | Qty: 90 | Fill #2

## 2020-05-15 MED FILL — ACYCLOVIR 800 MG TABLET: 800 | 30 days supply | Qty: 60 | Fill #4

## 2020-06-29 MED FILL — LEVOTHYROXINE 88 MCG TABLET: 88 | 90 days supply | Qty: 90 | Fill #3

## 2020-06-29 MED FILL — ACYCLOVIR 800 MG TABLET: 800 | 30 days supply | Qty: 60 | Fill #5

## 2020-07-08 DIAGNOSIS — Z20828 Contact with and (suspected) exposure to other viral communicable diseases: Secondary | ICD-10-CM | POA: Diagnosis not present

## 2020-08-23 ENCOUNTER — Telehealth: Payer: Self-pay | Admitting: Family Medicine

## 2020-08-23 ENCOUNTER — Encounter: Payer: Self-pay | Admitting: Family Medicine

## 2020-08-23 NOTE — Telephone Encounter (Signed)
Pt stayed home to take care of his wife Eric Lynch. Saw Eric Lynch in the office today.  WOrk note provided.

## 2020-08-30 MED FILL — ACYCLOVIR 800 MG TABLET: 800 | 30 days supply | Qty: 60 | Fill #6

## 2020-10-05 ENCOUNTER — Other Ambulatory Visit: Payer: Self-pay

## 2020-10-05 ENCOUNTER — Ambulatory Visit (INDEPENDENT_AMBULATORY_CARE_PROVIDER_SITE_OTHER): Payer: BC Managed Care – PPO | Admitting: Sports Medicine

## 2020-10-05 ENCOUNTER — Other Ambulatory Visit: Payer: Self-pay | Admitting: Sports Medicine

## 2020-10-05 VITALS — BP 122/75 | HR 73 | Ht 72.0 in | Wt 226.0 lb

## 2020-10-05 DIAGNOSIS — M19079 Primary osteoarthritis, unspecified ankle and foot: Secondary | ICD-10-CM | POA: Diagnosis not present

## 2020-10-05 DIAGNOSIS — E039 Hypothyroidism, unspecified: Secondary | ICD-10-CM

## 2020-10-05 DIAGNOSIS — L918 Other hypertrophic disorders of the skin: Secondary | ICD-10-CM | POA: Diagnosis not present

## 2020-10-05 DIAGNOSIS — Z0000 Encounter for general adult medical examination without abnormal findings: Secondary | ICD-10-CM | POA: Diagnosis not present

## 2020-10-05 MED ORDER — MELOXICAM 15 MG PO TABS: ORAL_TABLET | ORAL | 3 refills | Status: DC

## 2020-10-05 MED ORDER — LEVOTHYROXINE SODIUM 88 MCG PO TABS: 88.0000 ug | ORAL_TABLET | Freq: Every day | ORAL | 3 refills | Status: DC

## 2020-10-05 MED FILL — LEVOTHYROXINE 88 MCG TABLET: 88 | 90 days supply | Qty: 90 | Fill #0

## 2020-10-05 MED FILL — MELOXICAM 15 MG TABLET: 15 | 90 days supply | Qty: 90 | Fill #0

## 2020-10-05 NOTE — Assessment & Plan Note (Signed)
Annual physical exam as above, checking routine labs. 

## 2020-10-05 NOTE — Progress Notes (Signed)
Subjective:    CC: Annual Physical Exam  HPI:  This patient is here for their annual physical  I reviewed the past medical history, family history, social history, surgical history, and allergies today and no changes were needed.  Please see the problem list section below in epic for further details.  Past Medical History: Past Medical History:  Diagnosis Date  . Allergy   . Thyroid disease   . Vitamin D deficiency    Past Surgical History: Past Surgical History:  Procedure Laterality Date  . NO PAST SURGERIES     Social History: Social History   Socioeconomic History  . Marital status: Married    Spouse name: Not on file  . Number of children: Not on file  . Years of education: Not on file  . Highest education level: Not on file  Occupational History  . Not on file  Tobacco Use  . Smoking status: Never Smoker  . Smokeless tobacco: Never Used  Vaping Use  . Vaping Use: Never used  Substance and Sexual Activity  . Alcohol use: No  . Drug use: No  . Sexual activity: Not on file  Other Topics Concern  . Not on file  Social History Narrative  . Not on file   Social Determinants of Health   Financial Resource Strain: Not on file  Food Insecurity: Not on file  Transportation Needs: Not on file  Physical Activity: Not on file  Stress: Not on file  Social Connections: Not on file   Family History: Family History  Problem Relation Age of Onset  . Hypertension Father   . Diabetes Father   . Heart failure Father   . Cancer Father        BLADDER  . Hyperlipidemia Father   . Alcohol abuse Maternal Uncle   . Stroke Maternal Grandmother   . Heart attack Maternal Grandmother   . Alcohol abuse Maternal Grandfather    Allergies: Allergies  Allergen Reactions  . Prednisone    Medications: See med rec.  Review of Systems: No headache, visual changes, nausea, vomiting, diarrhea, constipation, dizziness, abdominal pain, skin rash, fevers, chills, night sweats,  swollen lymph nodes, weight loss, chest pain, body aches, joint swelling, muscle aches, shortness of breath, mood changes, visual or auditory hallucinations.  Objective:    General: Well Developed, well nourished, and in no acute distress.  Neuro: Alert and oriented x3, extra-ocular muscles intact, sensation grossly intact. Cranial nerves II through XII are intact, motor, sensory, and coordinative functions are all intact. HEENT: Normocephalic, atraumatic, pupils equal round reactive to light, neck supple, no masses, no lymphadenopathy, thyroid nonpalpable. Oropharynx, nasopharynx, external ear canals are unremarkable. Skin: Warm and dry, no rashes noted.  Cardiac: Regular rate and rhythm, no murmurs rubs or gallops.  Respiratory: Clear to auscultation bilaterally. Not using accessory muscles, speaking in full sentences.  Abdominal: Soft, nontender, nondistended, positive bowel sounds, no masses, no organomegaly.  Musculoskeletal: Shoulder, elbow, wrist, hip, knee, ankle stable, and with full range of motion.  Procedure:  Cryodestruction of #8 bilateral axillary skin tags Consent obtained and verified. Time-out conducted. Noted no overlying erythema, induration, or other signs of local infection. Completed without difficulty using Cryo-Gun. Advised to call if fevers/chills, erythema, induration, drainage, or persistent bleeding.  Impression and Recommendations:    The patient was counselled, risk factors were discussed, anticipatory guidance given.  Annual physical exam Annual physical exam as above, checking routine labs.  Skin tag Cryotherapy of approximately 8 bilateral axillary skin  tags, return as needed for this.   ___________________________________________ Ihor Austin. [name], M.D., ABFM., CAQSM. Primary Care and Sports Medicine [facility]  Adjunct Professor of Family Medicine  [employer]

## 2020-10-05 NOTE — Assessment & Plan Note (Signed)
Cryotherapy of approximately 8 bilateral axillary skin tags, return as needed for this.

## 2020-10-06 LAB — LIPID PANEL
Cholesterol: 114 mg/dL (ref <200)
HDL: 41 mg/dL (ref >=40)
LDL Cholesterol (Calc): 59 mg/dL (calc)
Non-HDL Cholesterol (Calc): 73 mg/dL (calc) (ref <130)
Total CHOL/HDL Ratio: 2.8 (calc) (ref <5.0)
Triglycerides: 48 mg/dL (ref <150)

## 2020-10-06 LAB — CBC
HCT: 45.8 % (ref 38.5–50.0)
Hemoglobin: 15.6 g/dL (ref 13.2–17.1)
MCH: 29.5 pg (ref 27.0–33.0)
MCHC: 34.1 g/dL (ref 32.0–36.0)
MCV: 86.6 fL (ref 80.0–100.0)
MPV: 9.8 fL (ref 7.5–12.5)
Platelets: 268 10*3/uL (ref 140–400)
RBC: 5.29 10*6/uL (ref 4.20–5.80)
RDW: 12.6 % (ref 11.0–15.0)
WBC: 5.9 10*3/uL (ref 3.8–10.8)

## 2020-10-06 LAB — TSH: TSH: 1.95 mIU/L (ref 0.40–4.50)

## 2020-10-06 LAB — COMPREHENSIVE METABOLIC PANEL
AG Ratio: 1.8 (calc) (ref 1.0–2.5)
ALT: 20 U/L (ref 9–46)
AST: 19 U/L (ref 10–40)
Albumin: 4.2 g/dL (ref 3.6–5.1)
Alkaline phosphatase (APISO): 63 U/L (ref 36–130)
BUN: 15 mg/dL (ref 7–25)
CO2: 28 mmol/L (ref 20–32)
Calcium: 9 mg/dL (ref 8.6–10.3)
Chloride: 108 mmol/L (ref 98–110)
Creat: 1.01 mg/dL (ref 0.60–1.35)
Globulin: 2.4 g/dL (calc) (ref 1.9–3.7)
Glucose, Bld: 91 mg/dL (ref 65–139)
Potassium: 4.4 mmol/L (ref 3.5–5.3)
Sodium: 140 mmol/L (ref 135–146)
Total Bilirubin: 0.7 mg/dL (ref 0.2–1.2)
Total Protein: 6.6 g/dL (ref 6.1–8.1)

## 2020-10-07 ENCOUNTER — Encounter: Payer: Self-pay | Admitting: Sports Medicine

## 2020-10-07 ENCOUNTER — Other Ambulatory Visit: Payer: Self-pay | Admitting: Sports Medicine

## 2020-10-07 MED ORDER — ACYCLOVIR 800 MG PO TABS: 800.0000 mg | ORAL_TABLET | Freq: Two times a day (BID) | ORAL | 3 refills | Status: DC

## 2020-10-07 MED FILL — ACYCLOVIR 800 MG TABLET: 800 | 90 days supply | Qty: 180 | Fill #0

## 2020-12-29 MED FILL — LEVOTHYROXINE 88 MCG TABLET: 88 | 90 days supply | Qty: 90 | Fill #1

## 2021-03-12 MED FILL — Acyclovir Tab 800 MG: ORAL | 90 days supply | Qty: 180 | Fill #0 | Status: AC

## 2021-03-13 ENCOUNTER — Other Ambulatory Visit (HOSPITAL_COMMUNITY): Payer: Self-pay

## 2021-03-14 ENCOUNTER — Other Ambulatory Visit (HOSPITAL_COMMUNITY): Payer: Self-pay

## 2021-03-29 ENCOUNTER — Other Ambulatory Visit (HOSPITAL_COMMUNITY): Payer: Self-pay

## 2021-03-29 MED FILL — Levothyroxine Sodium Tab 88 MCG: ORAL | 90 days supply | Qty: 90 | Fill #0 | Status: AC

## 2021-05-24 ENCOUNTER — Ambulatory Visit: Payer: Managed Care, Other (non HMO) | Admitting: Family Medicine

## 2021-05-24 ENCOUNTER — Encounter: Payer: Self-pay | Admitting: Family Medicine

## 2021-05-24 DIAGNOSIS — B356 Tinea cruris: Secondary | ICD-10-CM | POA: Insufficient documentation

## 2021-05-24 MED ORDER — FLUCONAZOLE 150 MG PO TABS: 150.0000 mg | ORAL_TABLET | Freq: Once | ORAL | 0 refills | Status: AC

## 2021-05-24 NOTE — Progress Notes (Signed)
Eric Lynch - 40 y.o. male MRN [id number]  Date of birth: 09-Jan-1981  Subjective Chief Complaint  Patient presents with   Recurrent Skin Infections    HPI Eric Lynch is a 40 year old male here today with complaint of rash.  He has noted rash along the buttock and perineum.  Rash is itchy.  He has had this before and reports he typically gets this about once per year.  Antifungals have been helpful for this in the past.  He has been using terbinafine with some improvement but has not resolved.  He denies any pain associated with this.    ROS:  A comprehensive ROS was completed and negative except as noted per HPI  Allergies  Allergen Reactions   Prednisone     Past Medical History:  Diagnosis Date   Allergy    Thyroid disease    Vitamin D deficiency     Past Surgical History:  Procedure Laterality Date   NO PAST SURGERIES      Social History   Socioeconomic History   Marital status: Married    Spouse name: Not on file   Number of children: Not on file   Years of education: Not on file   Highest education level: Not on file  Occupational History   Not on file  Tobacco Use   Smoking status: Never   Smokeless tobacco: Never  Vaping Use   Vaping Use: Never used  Substance and Sexual Activity   Alcohol use: No   Drug use: No   Sexual activity: Not on file  Other Topics Concern   Not on file  Social History Narrative   Not on file   Social Determinants of Health   Financial Resource Strain: Not on file  Food Insecurity: Not on file  Transportation Needs: Not on file  Physical Activity: Not on file  Stress: Not on file  Social Connections: Not on file    Family History  Problem Relation Age of Onset   Hypertension Father    Diabetes Father    Heart failure Father    Cancer Father        BLADDER   Hyperlipidemia Father    Alcohol abuse Maternal Uncle    Stroke Maternal Grandmother    Heart attack Maternal Grandmother    Alcohol abuse Maternal  Grandfather     Health Maintenance  Topic Date Due   Pneumococcal Vaccine 66-45 Years old (1 - PCV) Never done   COVID-19 Vaccine (3 - Pfizer risk series) 01/12/2020   INFLUENZA VACCINE  05/22/2021   TETANUS/TDAP  05/07/2026   Hepatitis C Screening  Completed   HIV Screening  Completed   HPV VACCINES  Aged Out     ----------------------------------------------------------------------------------------------------------------------------------------------------------------------------------------------------------------- Physical Exam BP 132/87 (BP Location: Left Arm, Patient Position: Sitting, Cuff Size: Large)   Pulse 74   Temp 98 F (36.7 C)   Ht 6' (1.829 m)   Wt 218 lb (98.9 kg)   SpO2 100%   BMI 29.57 kg/m   Physical Exam Constitutional:      Appearance: Normal appearance.  HENT:     Head: Normocephalic and atraumatic.  Eyes:     General: No scleral icterus. Musculoskeletal:     Cervical back: Neck supple.  Skin:    Comments: Erythema and scaling along the lower buttock and posterior perineum.  No scrotal involvement noted.  Neurological:     Mental Status: He is alert.  Psychiatric:        Mood and  Affect: Mood normal.        Behavior: Behavior normal.    ------------------------------------------------------------------------------------------------------------------------------------------------------------------------------------------------------------------- Assessment and Plan  Tinea cruris Recommend continuation of topical terbinafine.  Adding fluconazole as well.  He will let me know if not resolving with this.   Meds ordered this encounter  Medications   fluconazole (DIFLUCAN) 150 MG tablet    Sig: Take 1 tablet (150 mg total) by mouth once for 1 dose. Repeat after 72 hours    Dispense:  2 tablet    Refill:  0    No follow-ups on file.    This visit occurred during the SARS-CoV-2 public health emergency.  Safety protocols were in place,  including screening questions prior to the visit, additional usage of staff PPE, and extensive cleaning of exam room while observing appropriate contact time as indicated for disinfecting solutions.

## 2021-05-24 NOTE — Assessment & Plan Note (Signed)
Recommend continuation of topical terbinafine.  Adding fluconazole as well.  He will let me know if not resolving with this.

## 2021-05-28 ENCOUNTER — Encounter: Payer: Self-pay | Admitting: Emergency Medicine

## 2021-05-28 ENCOUNTER — Other Ambulatory Visit: Payer: Self-pay

## 2021-05-28 ENCOUNTER — Emergency Department: Admit: 2021-05-28 | Payer: Self-pay

## 2021-05-28 ENCOUNTER — Emergency Department
Admission: EM | Admit: 2021-05-28 | Discharge: 2021-05-28 | Disposition: A | Discharge status: Home / Self Care | Payer: Managed Care, Other (non HMO) | Source: Home / Self Care | Attending: Family Medicine | Admitting: Family Medicine

## 2021-05-28 DIAGNOSIS — B356 Tinea cruris: Secondary | ICD-10-CM

## 2021-05-28 DIAGNOSIS — K6289 Other specified diseases of anus and rectum: Secondary | ICD-10-CM

## 2021-05-28 MED ORDER — CLOTRIMAZOLE-BETAMETHASONE 1-0.05 % EX CREA: TOPICAL_CREAM | CUTANEOUS | 0 refills | Status: DC

## 2021-05-28 NOTE — Discharge Instructions (Addendum)
Use cream 2 x a day Maintain soft bowels, use fiber supplement if needed Use moist wipes in place of tissue Call your PCP if not improving by next week

## 2021-05-28 NOTE — ED Provider Notes (Signed)
Eric Lynch CARE    CSN: [id number] Arrival date & time: 05/28/21  1006      History   Chief Complaint Chief Complaint  Patient presents with   Rash    HPI Eric Lynch is a 40 y.o. male.   HPI  Patient states that he gets heat rash severe.  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  He does a lot of perspiration and sitting about [place].  The rashes are usually in his private area, groin and rectum.  He states that he has been told that it was caused by a fungal element.  He uses over-the-counter medications.  He states he had a rash that was not clearing so saw his physician last week.  Was given Diflucan and was told to continue using terbinafine.  He states that the rash may be improved, he can tell, he still has intense irritation around his rectum.  He has not been able to work for the last couple of days.  He is afraid of going back out in the heat with this rash, does not want to make it worse.  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 Authorizing Provider  acyclovir (ZOVIRAX) 800 MG tablet TAKE 1 TABLET BY MOUTH 2 TIMES DAILY. 10/07/20 10/07/21 Yes Monica Becton, MD  clotrimazole-betamethasone (LOTRISONE) cream Apply to affected area 2 times daily prn 05/28/21  Yes Eustace Moore, MD  fluticasone [facility]) 50 MCG/ACT nasal spray Place 2 sprays into both nostrils daily. 02/20/18  Yes  Agapito Games, MD  levothyroxine (SYNTHROID) 88 MCG tablet TAKE 1 TABLET BY MOUTH DAILY 10/05/20 10/05/21 Yes Monica Becton, MD  meloxicam (MOBIC) 15 MG tablet TAKE 1 TABLET BY MOUTH EVERY MORNING WITH BREAKFAST FOR 2 WEEKS THEN DAILY AS NEEDED FOR PAIN 10/05/20 10/05/21 Yes Monica Becton, MD    Family History Family History  Problem Relation Age of Onset   Hypertension Father    Diabetes Father    Heart failure Father    Cancer Father        BLADDER   Hyperlipidemia Father    Alcohol abuse Maternal Uncle    Stroke Maternal Grandmother    Heart attack Maternal Grandmother    Alcohol abuse Maternal Grandfather     Social History Social History   Tobacco Use   Smoking status: Never   Smokeless tobacco: Never  Vaping Use   Vaping Use: Never used  Substance Use Topics   Alcohol use: No   Drug use: No     Allergies   Prednisone   Review of Systems Review of Systems See HPI  Physical Exam Triage Vital Signs ED Triage Vitals  Enc Vitals Group     BP 05/28/21 1035 131/82     Pulse Rate 05/28/21 1035 71  Resp --      Temp 05/28/21 1035 98.4 F (36.9 C)     Temp Source 05/28/21 1035 Oral     SpO2 05/28/21 1035 99 %     Weight 05/28/21 1037 217 lb (98.4 kg)     Height 05/28/21 1037 5\' 9"  (1.753 m)     Head Circumference --      Peak Flow --      Pain Score 05/28/21 1036 0     Pain Loc --      Pain Edu? --      Excl. in GC? --    No data found.  Updated Vital Signs BP 131/82 (BP Location: Right Arm)   Pulse 71   Temp 98.4 F (36.9 C) (Oral)   Ht 5\' 9"  (1.753 m)   Wt 98.4 kg   SpO2 99%   BMI 32.05 kg/m   Physical Exam Constitutional:      General: He is not in acute distress.    Appearance: Normal appearance. He is well-developed.  HENT:     Head: Normocephalic and atraumatic.     Nose:     Comments: Mask is in place Eyes:     Conjunctiva/sclera: Conjunctivae normal.     Pupils: Pupils are equal, round, and reactive  to light.  Cardiovascular:     Rate and Rhythm: Normal rate.  Pulmonary:     Effort: Pulmonary effort is normal. No respiratory distress.  Abdominal:     General: There is no distension.     Palpations: Abdomen is soft.  Genitourinary:    Comments: There is diffuse erythema around the rectum.  No possible vesicles.  No raised areas.  No fissures.  No active infection or discharge. Musculoskeletal:        General: Normal range of motion.     Cervical back: Normal range of motion.  Skin:    General: Skin is warm and dry.  Neurological:     Mental Status: He is alert.     UC Treatments / Results  Labs (all labs ordered are listed, but only abnormal results are displayed) Labs Reviewed - No data to display  EKG   Radiology No results found.  Procedures Procedures (including critical care time)  Medications Ordered in UC Medications - No data to display  Initial Impression / Assessment and Plan / UC Course  I have reviewed the triage vital signs and the nursing notes.  Pertinent labs & imaging results that were available during my care of the patient were reviewed by me and considered in my medical decision making (see chart for details).     Patient has a proctitis that he finds very uncomfortable.  Apparently he has had fungal infections in the past and is being treated for 1 at this time.  We will add a steroid to his cream use and give him some other advice.  See discharge instruction.  See primary care fails to improve. Final Clinical Impressions(s) / UC Diagnoses   Final diagnoses:  Tinea cruris  Proctitis     Discharge Instructions      Use cream 2 x a day Maintain soft bowels, use fiber supplement if needed Use moist wipes in place of tissue Call your PCP if not improving by next week        ED Prescriptions     Medication Sig Dispense Auth. Provider   clotrimazole-betamethasone (LOTRISONE) cream Apply to affected area 2 times daily prn 15 g  07/28/21  Fannie Knee, MD      PDMP not reviewed this encounter.   Eustace Moore, MD 05/28/21 1154

## 2021-05-28 NOTE — ED Triage Notes (Signed)
Patient c/o rash in his buttocks area x 1 week.  Patient seen his PCP on Wednesday given Diflucan for possible yeast and continue using OTC creams.  The area is not painful, itchy and redness, no drainage.

## 2021-06-28 ENCOUNTER — Other Ambulatory Visit (HOSPITAL_COMMUNITY): Payer: Self-pay

## 2021-06-28 MED FILL — Levothyroxine Sodium Tab 88 MCG: ORAL | 90 days supply | Qty: 90 | Fill #1 | Status: AC

## 2021-07-27 ENCOUNTER — Other Ambulatory Visit (HOSPITAL_COMMUNITY): Payer: Self-pay

## 2021-07-27 MED FILL — Acyclovir Tab 800 MG: ORAL | 90 days supply | Qty: 180 | Fill #1 | Status: AC

## 2021-07-28 ENCOUNTER — Other Ambulatory Visit (HOSPITAL_COMMUNITY): Payer: Self-pay

## 2021-09-20 ENCOUNTER — Other Ambulatory Visit: Payer: Self-pay | Admitting: Sports Medicine

## 2021-09-20 DIAGNOSIS — E039 Hypothyroidism, unspecified: Secondary | ICD-10-CM

## 2021-09-21 ENCOUNTER — Other Ambulatory Visit (HOSPITAL_COMMUNITY): Payer: Self-pay

## 2021-09-21 ENCOUNTER — Other Ambulatory Visit: Payer: Self-pay | Admitting: Sports Medicine

## 2021-09-21 DIAGNOSIS — E039 Hypothyroidism, unspecified: Secondary | ICD-10-CM

## 2021-09-21 MED FILL — Levothyroxine Sodium Tab 88 MCG: ORAL | 30 days supply | Qty: 30 | Fill #0 | Status: AC

## 2021-10-16 ENCOUNTER — Emergency Department: Admit: 2021-10-16 | Discharge status: Absent without leave | Payer: Self-pay

## 2021-10-16 ENCOUNTER — Emergency Department
Admission: EM | Admit: 2021-10-16 | Discharge: 2021-10-16 | Disposition: A | Discharge status: Home / Self Care | Payer: Managed Care, Other (non HMO) | Source: Home / Self Care

## 2021-10-16 DIAGNOSIS — J0100 Acute maxillary sinusitis, unspecified: Secondary | ICD-10-CM | POA: Diagnosis not present

## 2021-10-16 MED ORDER — AMOXICILLIN-POT CLAVULANATE 875-125 MG PO TABS: 1.0000 | ORAL_TABLET | Freq: Two times a day (BID) | ORAL | 0 refills | Status: DC

## 2021-10-16 NOTE — ED Triage Notes (Signed)
Pt states that he has some nasal congestion, cough and drainage from eyes. X1 week  Pt states that he had a negative covid test x2 days ago.   Pt states that he is vaccinated. Pt states that he hasn't had flu vaccine.

## 2021-10-16 NOTE — ED Provider Notes (Signed)
Eric Lynch CARE    CSN: [id number] Arrival date & time: 10/16/21  0931      History   Chief Complaint Chief Complaint  Patient presents with   Nasal Congestion    Nasal congestion, and cough. X1 week    HPI Eric Lynch is a 40 y.o. male.   HPI 40 year old male presents with nasal congestion, cough and drainage from eyes for 1 week.  Patient reports is vaccinated for COVID-19 and has had negative home COVID test 2 days ago.  Patient reports he is not vaccinated for influenza this year.  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 Authorizing Provider  amoxicillin-clavulanate (AUGMENTIN) 875-125 MG tablet Take 1 tablet by mouth every 12 (twelve) hours. 10/16/21  Yes Trevor Iha, FNP  fluticasone (FLONASE) 50 MCG/ACT nasal spray Place 2 sprays into both nostrils daily. 02/20/18  Yes Agapito Games, MD  levothyroxine (SYNTHROID) 88 MCG tablet TAKE 1 TABLET BY MOUTH DAILY (need appt for refills) 09/21/21 09/21/22 Yes Monica Becton, MD  acyclovir (ZOVIRAX) 800 MG tablet TAKE 1 TABLET BY MOUTH 2 TIMES DAILY. 10/07/20 10/29/21  Monica Becton, MD  clotrimazole-betamethasone (LOTRISONE) cream Apply to affected area 2 times daily prn 05/28/21   Eustace Moore, MD    Family History Family History  Problem Relation Age of Onset   Hypertension Father    Diabetes Father    Heart failure Father     Cancer Father        BLADDER   Hyperlipidemia Father    Alcohol abuse Maternal Uncle    Stroke Maternal Grandmother    Heart attack Maternal Grandmother    Alcohol abuse Maternal Grandfather     Social History Social History   Tobacco Use   Smoking status: Never   Smokeless tobacco: Never  Vaping Use   Vaping Use: Never used  Substance Use Topics   Alcohol use: No   Drug use: No     Allergies   Prednisone   Review of Systems Review of Systems  HENT:  Positive for congestion and sinus pressure.   Respiratory:  Positive for cough.   All other systems reviewed and are negative.   Physical Exam Triage Vital Signs ED Triage Vitals  Enc Vitals Group     BP 10/16/21 0945 139/82     Pulse Rate 10/16/21 0945 94     Resp 10/16/21 0945 20     Temp 10/16/21 0945 98.3 F (36.8 C)     Temp Source 10/16/21 0945 Oral     SpO2 10/16/21 0945 98 %     Weight 10/16/21 0943 220 lb (99.8 kg)     Height 10/16/21 0943 5\' 9"  (1.753 m)     Head Circumference --      Peak Flow --  Pain Score 10/16/21 0943 0     Pain Loc --      Pain Edu? --      Excl. in GC? --    No data found.  Updated Vital Signs BP 139/82 (BP Location: Left Arm)    Pulse 94    Temp 98.3 F (36.8 C) (Oral)    Resp 20    Ht 5\' 9"  (1.753 m)    Wt 220 lb (99.8 kg)    SpO2 98%    BMI 32.49 kg/m   Physical Exam Vitals and nursing note reviewed.  Constitutional:      General: He is not in acute distress.    Appearance: Normal appearance. He is obese. He is not ill-appearing.  HENT:     Head: Normocephalic and atraumatic.     Right Ear: Tympanic membrane and external ear normal.     Left Ear: Tympanic membrane and external ear normal.     Ears:     Comments: Moderate eustachian tube dysfunction noted bilaterally    Mouth/Throat:     Mouth: Mucous membranes are moist.     Pharynx: Oropharynx is clear.  Eyes:     Extraocular Movements: Extraocular movements intact.     Conjunctiva/sclera:  Conjunctivae normal.     Pupils: Pupils are equal, round, and reactive to light.  Cardiovascular:     Rate and Rhythm: Normal rate and regular rhythm.     Pulses: Normal pulses.     Heart sounds: Normal heart sounds.  Pulmonary:     Effort: Pulmonary effort is normal.     Breath sounds: Normal breath sounds.  Musculoskeletal:        General: Normal range of motion.     Cervical back: Normal range of motion and neck supple.  Skin:    General: Skin is warm and dry.  Neurological:     General: No focal deficit present.     Mental Status: He is alert and oriented to person, place, and time.     UC Treatments / Results  Labs (all labs ordered are listed, but only abnormal results are displayed) Labs Reviewed - No data to display  EKG   Radiology No results found.  Procedures Procedures (including critical care time)  Medications Ordered in UC Medications - No data to display  Initial Impression / Assessment and Plan / UC Course  I have reviewed the triage vital signs and the nursing notes.  Pertinent labs & imaging results that were available during my care of the patient were reviewed by me and considered in my medical decision making (see chart for details).     MDM: 1.  Acute maxillary sinusitis, recurrence not specified-Rx'd Augmentin. Advised patient to take medication as directed with food to completion.  Encouraged patient to increase daily water intake while taking this medication.  Work note for 1 week written per patient request.  Discharged home, hemodynamically stable. Final Clinical Impressions(s) / UC Diagnoses   Final diagnoses:  Acute maxillary sinusitis, recurrence not specified     Discharge Instructions      Advised patient to take medication as directed with food to completion.  Encouraged patient to increase daily water intake while taking this medication.     ED Prescriptions     Medication Sig Dispense Auth. Provider    amoxicillin-clavulanate (AUGMENTIN) 875-125 MG tablet Take 1 tablet by mouth every 12 (twelve) hours. 14 tablet , FNP      PDMP not reviewed this  encounter.   Trevor Iha, FNP 10/16/21 [phone]

## 2021-10-16 NOTE — Discharge Instructions (Addendum)
Advised patient to take medication as directed with food to completion.  Encouraged patient to increase daily water intake while taking this medication. 

## 2021-10-31 ENCOUNTER — Other Ambulatory Visit (HOSPITAL_COMMUNITY): Payer: Self-pay

## 2021-10-31 ENCOUNTER — Other Ambulatory Visit: Payer: Self-pay

## 2021-10-31 ENCOUNTER — Ambulatory Visit: Payer: Managed Care, Other (non HMO) | Admitting: Sports Medicine

## 2021-10-31 DIAGNOSIS — E039 Hypothyroidism, unspecified: Secondary | ICD-10-CM

## 2021-10-31 DIAGNOSIS — J310 Chronic rhinitis: Secondary | ICD-10-CM | POA: Insufficient documentation

## 2021-10-31 DIAGNOSIS — J00 Acute nasopharyngitis [common cold]: Secondary | ICD-10-CM | POA: Diagnosis not present

## 2021-10-31 MED ORDER — IPRATROPIUM BROMIDE 0.06 % NA SOLN
2.0000 | Freq: Four times a day (QID) | NASAL | 11 refills | Status: DC
  Filled 2021-10-31: qty 15, 25d supply, fill #0

## 2021-10-31 MED ORDER — LEVOTHYROXINE SODIUM 88 MCG PO TABS
88.0000 ug | ORAL_TABLET | Freq: Every day | ORAL | 3 refills | Status: DC
  Filled 2021-10-31: qty 90, 90d supply, fill #0
  Filled 2022-01-28: qty 90, 90d supply, fill #1
  Filled 2022-04-23: qty 90, 90d supply, fill #2
  Filled 2022-07-18: qty 90, 90d supply, fill #3

## 2021-10-31 MED ORDER — LEVOTHYROXINE SODIUM 88 MCG PO TABS
ORAL_TABLET | Freq: Every day | ORAL | 0 refills | Status: DC
  Filled 2021-10-31: qty 30, fill #0

## 2021-10-31 MED ORDER — ACYCLOVIR 800 MG PO TABS
ORAL_TABLET | Freq: Two times a day (BID) | ORAL | 3 refills | Status: DC
  Filled 2021-10-31: qty 180, 90d supply, fill #0
  Filled 2022-03-28: qty 163, 75d supply, fill #1
  Filled 2022-03-29: qty 17, 15d supply, fill #1
  Filled 2022-08-28: qty 180, 90d supply, fill #2

## 2021-10-31 NOTE — Assessment & Plan Note (Signed)
This is a pleasant 41 year old male, he was recently treated in urgent care for what sounds to be maxillary sinusitis, rhinitis. He is overall doing a lot better, mostly symptom-free with the exception of some rhinorrhea. Nasal turbinates are boggy and erythematous, he is using Flonase without efficacy, adding nasal Atrovent.  Return to see me as needed.

## 2021-10-31 NOTE — Assessment & Plan Note (Signed)
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

## 2021-10-31 NOTE — Progress Notes (Signed)
° ° °  Procedures performed today:    None.  Independent interpretation of notes and tests performed by another provider:   None.  Brief History, Exam, Impression, and Recommendations:    Rhinitis This is a pleasant 41 year old male, he was recently treated in urgent care for what sounds to be maxillary sinusitis, rhinitis. He is overall doing a lot better, mostly symptom-free with the exception of some rhinorrhea. Nasal turbinates are boggy and erythematous, he is using Flonase without efficacy, adding nasal Atrovent.  Return to see me as needed.  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    ___________________________________________ Eric Lynch. [name], M.D., ABFM., CAQSM. Primary Care and Sports Medicine [place] [facility]  Adjunct Instructor of Family Medicine  [employer]

## 2021-11-01 ENCOUNTER — Other Ambulatory Visit (HOSPITAL_COMMUNITY): Payer: Self-pay

## 2022-01-29 ENCOUNTER — Other Ambulatory Visit (HOSPITAL_COMMUNITY): Payer: Self-pay

## 2022-03-29 ENCOUNTER — Other Ambulatory Visit (HOSPITAL_COMMUNITY): Payer: Self-pay

## 2022-04-23 ENCOUNTER — Other Ambulatory Visit (HOSPITAL_COMMUNITY): Payer: Self-pay

## 2022-07-18 ENCOUNTER — Other Ambulatory Visit (HOSPITAL_COMMUNITY): Payer: Self-pay

## 2022-07-19 ENCOUNTER — Other Ambulatory Visit (HOSPITAL_COMMUNITY): Payer: Self-pay

## 2022-08-29 ENCOUNTER — Other Ambulatory Visit (HOSPITAL_COMMUNITY): Payer: Self-pay

## 2022-10-11 ENCOUNTER — Other Ambulatory Visit (HOSPITAL_COMMUNITY): Payer: Self-pay

## 2022-10-11 ENCOUNTER — Ambulatory Visit: Payer: Managed Care, Other (non HMO) | Admitting: Sports Medicine

## 2022-10-11 ENCOUNTER — Encounter: Payer: Self-pay | Admitting: Sports Medicine

## 2022-10-11 ENCOUNTER — Ambulatory Visit: Payer: Managed Care, Other (non HMO)

## 2022-10-11 VITALS — BP 138/83 | HR 115 | Temp 99.5°F | Wt 225.0 lb

## 2022-10-11 DIAGNOSIS — J00 Acute nasopharyngitis [common cold]: Secondary | ICD-10-CM

## 2022-10-11 DIAGNOSIS — J0100 Acute maxillary sinusitis, unspecified: Secondary | ICD-10-CM

## 2022-10-11 DIAGNOSIS — J209 Acute bronchitis, unspecified: Secondary | ICD-10-CM

## 2022-10-11 DIAGNOSIS — R053 Chronic cough: Secondary | ICD-10-CM

## 2022-10-11 DIAGNOSIS — E039 Hypothyroidism, unspecified: Secondary | ICD-10-CM | POA: Diagnosis not present

## 2022-10-11 DIAGNOSIS — J101 Influenza due to other identified influenza virus with other respiratory manifestations: Secondary | ICD-10-CM

## 2022-10-11 LAB — POCT INFLUENZA A/B
Influenza A, POC: NEGATIVE
Influenza B, POC: POSITIVE — AB

## 2022-10-11 MED ORDER — IPRATROPIUM BROMIDE 0.06 % NA SOLN
2.0000 | Freq: Four times a day (QID) | NASAL | 11 refills | Status: DC
  Filled 2022-10-11: qty 15, 19d supply, fill #0

## 2022-10-11 MED ORDER — AZITHROMYCIN 250 MG PO TABS
ORAL_TABLET | ORAL | 0 refills | Status: AC
  Filled 2022-10-11: qty 6, 5d supply, fill #0

## 2022-10-11 MED ORDER — ACYCLOVIR 800 MG PO TABS
800.0000 mg | ORAL_TABLET | Freq: Two times a day (BID) | ORAL | 3 refills | Status: DC
  Filled 2022-10-11: qty 180, fill #0
  Filled 2022-12-06 (×3): qty 180, 90d supply, fill #0
  Filled 2023-05-27: qty 180, 90d supply, fill #1
  Filled 2023-10-01 (×2): qty 180, 90d supply, fill #0

## 2022-10-11 MED ORDER — OSELTAMIVIR PHOSPHATE 75 MG PO CAPS
75.0000 mg | ORAL_CAPSULE | Freq: Two times a day (BID) | ORAL | 0 refills | Status: DC
  Filled 2022-10-11: qty 10, 5d supply, fill #0

## 2022-10-11 MED ORDER — HYDROCOD POLI-CHLORPHE POLI ER 10-8 MG/5ML PO SUER
5.0000 mL | Freq: Two times a day (BID) | ORAL | 0 refills | Status: DC | PRN
  Filled 2022-10-11: qty 120, 12d supply, fill #0

## 2022-10-11 MED ORDER — FLUTICASONE PROPIONATE 50 MCG/ACT NA SUSP
2.0000 | Freq: Every day | NASAL | 6 refills | Status: DC
  Filled 2022-10-11: qty 16, 30d supply, fill #0

## 2022-10-11 MED ORDER — LEVOTHYROXINE SODIUM 88 MCG PO TABS
88.0000 ug | ORAL_TABLET | Freq: Every day | ORAL | 3 refills | Status: DC
  Filled 2022-10-11: qty 90, 90d supply, fill #0
  Filled 2023-01-16: qty 90, 90d supply, fill #1
  Filled 2023-04-15: qty 90, 90d supply, fill #2
  Filled 2023-07-09: qty 90, 90d supply, fill #0
  Filled 2023-07-09: qty 90, 90d supply, fill #3

## 2022-10-11 NOTE — Assessment & Plan Note (Addendum)
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 COVID test negative this morning, we ran a flu test today it was positive for influenza B. Adding Tamiflu, azithromycin (he does have a history of pneumonia and is at high risk for bacterial superinfection), chest x-ray, Tussionex. Declined steroids. Return to see me if not better in a couple of weeks.

## 2022-10-11 NOTE — Assessment & Plan Note (Signed)
Well-controlled hypothyroidism, he had labs done in June of this year, TSH was normal, these were done through [place] with his job.

## 2022-10-11 NOTE — Progress Notes (Signed)
    Procedures performed today:    None.  Independent interpretation of notes and tests performed by another provider:   None.  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 COVID test negative this morning, we ran a flu test today it was positive for influenza B. Adding Tamiflu, azithromycin (he does have a history of pneumonia and is at high risk for bacterial superinfection), chest x-ray, Tussionex. Declined steroids. Return to see me if not better in a couple of weeks.  Hypothyroidism Well-controlled hypothyroidism, he had labs done in June of this year, TSH was normal, these were done through [place] with his job.  I spent 30 minutes of total time managing this patient today, this includes chart review, face to face, and non-face to face time.  ____________________________________________ Ihor Austin. [name], M.D., ABFM., CAQSM., AME. Primary Care and Sports Medicine [place] [facility]  Adjunct Professor of Family Medicine  [employer] [place] [employer]  Restaurant manager, fast food

## 2022-10-13 ENCOUNTER — Encounter: Payer: Self-pay | Admitting: Sports Medicine

## 2022-12-06 ENCOUNTER — Other Ambulatory Visit (HOSPITAL_COMMUNITY): Payer: Self-pay

## 2022-12-06 ENCOUNTER — Other Ambulatory Visit: Payer: Self-pay

## 2022-12-10 ENCOUNTER — Other Ambulatory Visit (HOSPITAL_COMMUNITY): Payer: Self-pay

## 2022-12-13 ENCOUNTER — Other Ambulatory Visit (HOSPITAL_COMMUNITY): Payer: Self-pay

## 2023-01-17 ENCOUNTER — Other Ambulatory Visit (HOSPITAL_COMMUNITY): Payer: Self-pay

## 2023-02-08 LAB — LAB REPORT - SCANNED: EGFR: 81

## 2023-05-31 ENCOUNTER — Other Ambulatory Visit: Payer: Self-pay

## 2023-07-09 ENCOUNTER — Other Ambulatory Visit: Payer: Self-pay

## 2023-07-10 ENCOUNTER — Other Ambulatory Visit (HOSPITAL_COMMUNITY): Payer: Self-pay

## 2023-07-11 ENCOUNTER — Other Ambulatory Visit: Payer: Self-pay

## 2023-08-28 ENCOUNTER — Other Ambulatory Visit: Payer: Self-pay

## 2023-10-01 ENCOUNTER — Other Ambulatory Visit: Payer: Self-pay

## 2023-10-01 ENCOUNTER — Other Ambulatory Visit: Payer: Self-pay | Admitting: Sports Medicine

## 2023-10-01 DIAGNOSIS — E039 Hypothyroidism, unspecified: Secondary | ICD-10-CM

## 2023-10-03 ENCOUNTER — Other Ambulatory Visit: Payer: Self-pay

## 2023-10-08 ENCOUNTER — Other Ambulatory Visit: Payer: Self-pay

## 2023-10-18 ENCOUNTER — Ambulatory Visit: Payer: Managed Care, Other (non HMO) | Admitting: Sports Medicine

## 2023-10-18 ENCOUNTER — Encounter: Payer: Self-pay | Admitting: Sports Medicine

## 2023-10-18 ENCOUNTER — Other Ambulatory Visit (HOSPITAL_COMMUNITY): Payer: Self-pay

## 2023-10-18 VITALS — BP 120/68 | HR 88 | Ht 69.0 in | Wt 226.0 lb

## 2023-10-18 DIAGNOSIS — Z0000 Encounter for general adult medical examination without abnormal findings: Secondary | ICD-10-CM | POA: Diagnosis not present

## 2023-10-18 DIAGNOSIS — E039 Hypothyroidism, unspecified: Secondary | ICD-10-CM | POA: Diagnosis not present

## 2023-10-18 MED ORDER — LEVOTHYROXINE SODIUM 88 MCG PO TABS
88.0000 ug | ORAL_TABLET | Freq: Every day | ORAL | 3 refills | Status: DC
  Filled 2023-10-18 – 2023-10-21 (×2): qty 90, 90d supply, fill #0
  Filled 2023-12-31 – 2024-01-15 (×2): qty 90, 90d supply, fill #1
  Filled 2024-04-10: qty 90, 90d supply, fill #0
  Filled 2024-07-12: qty 90, 90d supply, fill #1

## 2023-10-18 NOTE — Assessment & Plan Note (Signed)
TSH stable in April. Refilling levothyroxine.

## 2023-10-18 NOTE — Progress Notes (Signed)
Subjective:    CC: Annual Physical Exam  HPI:  This patient is here for their annual physical  I reviewed the past medical history, family history, social history, surgical history, and allergies today and no changes were needed.  Please see the problem list section below in epic for further details.  Past Medical History: Past Medical History:  Diagnosis Date   Allergy    Thyroid disease    Vitamin D deficiency    Past Surgical History: Past Surgical History:  Procedure Laterality Date   NO PAST SURGERIES     Social History: Social History   Socioeconomic History   Marital status: Married    Spouse name: Not on file   Number of children: Not on file   Years of education: Not on file   Highest education level: Not on file  Occupational History   Not on file  Tobacco Use   Smoking status: Never   Smokeless tobacco: Never  Vaping Use   Vaping status: Never Used  Substance and Sexual Activity   Alcohol use: No   Drug use: No   Sexual activity: Not on file  Other Topics Concern   Not on file  Social History Narrative   Not on file   Social Drivers of Health   Financial Resource Strain: Not on file  Food Insecurity: Not on file  Transportation Needs: Not on file  Physical Activity: Not on file  Stress: Not on file  Social Connections: Unknown (06/13/2023)   Received from [facility]   Social Network    Social Network: Not on file   Family History: Family History  Problem Relation Age of Onset   Hypertension Father    Diabetes Father    Heart failure Father    Cancer Father        BLADDER   Hyperlipidemia Father    Alcohol abuse Maternal Uncle    Stroke Maternal Grandmother    Heart attack Maternal Grandmother    Alcohol abuse Maternal Grandfather    Allergies: Allergies  Allergen Reactions   Prednisone    Medications: See med rec.  Review of Systems: No headache, visual changes, nausea, vomiting, diarrhea, constipation, dizziness, abdominal  pain, skin rash, fevers, chills, night sweats, swollen lymph nodes, weight loss, chest pain, body aches, joint swelling, muscle aches, shortness of breath, mood changes, visual or auditory hallucinations.  Objective:    General: Well Developed, well nourished, and in no acute distress.  Neuro: Alert and oriented x3, extra-ocular muscles intact, sensation grossly intact. Cranial nerves II through XII are intact, motor, sensory, and coordinative functions are all intact. HEENT: Normocephalic, atraumatic, pupils equal round reactive to light, neck supple, no masses, no lymphadenopathy, thyroid nonpalpable. Oropharynx, nasopharynx, external ear canals are unremarkable. Skin: Warm and dry, no rashes noted.  Cardiac: Regular rate and rhythm, no murmurs rubs or gallops.  Respiratory: Clear to auscultation bilaterally. Not using accessory muscles, speaking in full sentences.  Abdominal: Soft, nontender, nondistended, positive bowel sounds, no masses, no organomegaly.  Musculoskeletal: Shoulder, elbow, wrist, hip, knee, ankle stable, and with full range of motion.  Impression and Recommendations:    The patient was counselled, risk factors were discussed, anticipatory guidance given.  Annual physical exam Fasting annual physical as above, he did get his labs in April with his work, these will be scanned in. Declines flu shot. Return to see me in 1 year.   Hypothyroidism TSH stable in April. Refilling levothyroxine.   ____________________________________________ Ihor Austin. [name], M.D.,  ABFM., CAQSM., AME. Primary Care and Sports Medicine [place] [facility]  Adjunct Professor of Family Medicine  [employer]  Restaurant manager, fast food

## 2023-10-18 NOTE — Assessment & Plan Note (Signed)
Fasting annual physical as above, he did get his labs in April with his work, these will be scanned in. Declines flu shot. Return to see me in 1 year.

## 2023-10-21 ENCOUNTER — Other Ambulatory Visit: Payer: Self-pay

## 2023-11-01 ENCOUNTER — Other Ambulatory Visit (HOSPITAL_COMMUNITY): Payer: Self-pay

## 2023-12-31 ENCOUNTER — Other Ambulatory Visit: Payer: Self-pay

## 2023-12-31 ENCOUNTER — Other Ambulatory Visit: Payer: Self-pay | Admitting: Sports Medicine

## 2023-12-31 MED ORDER — ACYCLOVIR 800 MG PO TABS
800.0000 mg | ORAL_TABLET | Freq: Two times a day (BID) | ORAL | 1 refills | Status: AC
  Filled 2023-12-31: qty 180, 90d supply, fill #0
  Filled 2024-04-08: qty 180, 90d supply, fill #1
  Filled 2024-04-10: qty 180, 90d supply, fill #0

## 2024-01-02 ENCOUNTER — Other Ambulatory Visit: Payer: Self-pay

## 2024-01-02 ENCOUNTER — Other Ambulatory Visit (HOSPITAL_COMMUNITY): Payer: Self-pay

## 2024-01-07 ENCOUNTER — Ambulatory Visit: Admitting: Sports Medicine

## 2024-01-07 ENCOUNTER — Other Ambulatory Visit: Payer: Self-pay

## 2024-01-07 ENCOUNTER — Encounter: Payer: Self-pay | Admitting: Sports Medicine

## 2024-01-07 VITALS — BP 120/81 | HR 81 | Ht 69.0 in | Wt 235.0 lb

## 2024-01-07 DIAGNOSIS — L601 Onycholysis: Secondary | ICD-10-CM

## 2024-01-07 DIAGNOSIS — E039 Hypothyroidism, unspecified: Secondary | ICD-10-CM | POA: Diagnosis not present

## 2024-01-07 MED ORDER — TERBINAFINE HCL 250 MG PO TABS
250.0000 mg | ORAL_TABLET | Freq: Every day | ORAL | 1 refills | Status: AC
  Filled 2024-01-07: qty 90, 90d supply, fill #0
  Filled 2024-04-08: qty 90, 90d supply, fill #1
  Filled 2024-04-10: qty 90, 90d supply, fill #0

## 2024-01-07 NOTE — Progress Notes (Signed)
    Procedures performed today:    None.  Independent interpretation of notes and tests performed by another provider:   None.  Brief History, Exam, Impression, and Recommendations:    Onycholysis of right great toenail Months of thick and yellowish discoloration right great toenail, appears to be growing out, we will add Lamisil for 3 months, return in 3 months, he will take a picture of his nail with his cell phone so we can compare.    ____________________________________________ Ihor Austin. [name], M.D., ABFM., CAQSM., AME. Primary Care and Sports Medicine [place] [facility]  Adjunct Professor of Family Medicine  [place] [employer]  Restaurant manager, fast food

## 2024-01-07 NOTE — Assessment & Plan Note (Signed)
 Months of thick and yellowish discoloration right great toenail, appears to be growing out, we will add Lamisil for 3 months, return in 3 months, he will take a picture of his nail with his cell phone so we can compare.

## 2024-01-08 LAB — CBC
Hematocrit: 45 % (ref 37.5–51.0)
Hemoglobin: 15 g/dL (ref 13.0–17.7)
MCH: 29.5 pg (ref 26.6–33.0)
MCHC: 33.3 g/dL (ref 31.5–35.7)
MCV: 88 fL (ref 79–97)
Platelets: 261 10*3/uL (ref 150–450)
RBC: 5.09 x10E6/uL (ref 4.14–5.80)
RDW: 12.9 % (ref 11.6–15.4)
WBC: 5 10*3/uL (ref 3.4–10.8)

## 2024-01-08 LAB — TSH: TSH: 1.22 u[IU]/mL (ref 0.450–4.500)

## 2024-01-08 LAB — HEMOGLOBIN A1C
Est. average glucose Bld gHb Est-mCnc: 108 mg/dL
Hgb A1c MFr Bld: 5.4 % (ref 4.8–5.6)

## 2024-01-08 LAB — COMPREHENSIVE METABOLIC PANEL
ALT: 15 IU/L (ref 0–44)
AST: 16 IU/L (ref 0–40)
Albumin: 4.2 g/dL (ref 4.1–5.1)
Alkaline Phosphatase: 70 IU/L (ref 44–121)
BUN/Creatinine Ratio: 12 (ref 9–20)
BUN: 13 mg/dL (ref 6–24)
Bilirubin Total: 0.5 mg/dL (ref 0.0–1.2)
CO2: 22 mmol/L (ref 20–29)
Calcium: 8.6 mg/dL — ABNORMAL LOW (ref 8.7–10.2)
Chloride: 107 mmol/L — ABNORMAL HIGH (ref 96–106)
Creatinine, Ser: 1.06 mg/dL (ref 0.76–1.27)
Globulin, Total: 2.3 g/dL (ref 1.5–4.5)
Glucose: 93 mg/dL (ref 70–99)
Potassium: 4.3 mmol/L (ref 3.5–5.2)
Sodium: 141 mmol/L (ref 134–144)
Total Protein: 6.5 g/dL (ref 6.0–8.5)
eGFR: 89 mL/min/{1.73_m2} (ref >59)

## 2024-01-08 LAB — LIPID PANEL
Chol/HDL Ratio: 2.9 ratio (ref 0.0–5.0)
Cholesterol, Total: 115 mg/dL (ref 100–199)
HDL: 39 mg/dL — ABNORMAL LOW (ref >39)
LDL Chol Calc (NIH): 65 mg/dL (ref 0–99)
Triglycerides: 46 mg/dL (ref 0–149)
VLDL Cholesterol Cal: 11 mg/dL (ref 5–40)

## 2024-01-09 ENCOUNTER — Other Ambulatory Visit: Payer: Self-pay

## 2024-01-15 ENCOUNTER — Other Ambulatory Visit: Payer: Self-pay

## 2024-01-17 ENCOUNTER — Other Ambulatory Visit: Payer: Self-pay

## 2024-04-08 ENCOUNTER — Other Ambulatory Visit: Payer: Self-pay

## 2024-04-10 ENCOUNTER — Other Ambulatory Visit (HOSPITAL_COMMUNITY): Payer: Self-pay

## 2024-04-10 ENCOUNTER — Other Ambulatory Visit: Payer: Self-pay

## 2024-04-11 ENCOUNTER — Other Ambulatory Visit (HOSPITAL_COMMUNITY): Payer: Self-pay

## 2024-04-11 MED ORDER — LEVOTHYROXINE SODIUM 88 MCG PO TABS
88.0000 ug | ORAL_TABLET | Freq: Every day | ORAL | 3 refills | Status: DC
  Filled 2024-04-11: qty 90, 90d supply, fill #0

## 2024-04-13 ENCOUNTER — Other Ambulatory Visit (HOSPITAL_COMMUNITY): Payer: Self-pay

## 2024-06-23 ENCOUNTER — Encounter: Payer: Self-pay | Admitting: Sports Medicine

## 2024-07-12 ENCOUNTER — Other Ambulatory Visit (HOSPITAL_COMMUNITY): Payer: Self-pay

## 2024-10-06 ENCOUNTER — Other Ambulatory Visit: Payer: Self-pay

## 2024-10-06 ENCOUNTER — Other Ambulatory Visit (HOSPITAL_COMMUNITY): Payer: Self-pay

## 2024-10-06 MED ORDER — ACYCLOVIR 800 MG PO TABS
800.0000 mg | ORAL_TABLET | Freq: Two times a day (BID) | ORAL | 3 refills | Status: AC
  Filled 2024-10-06: qty 180, 90d supply, fill #0

## 2024-10-06 MED ORDER — LEVOTHYROXINE SODIUM 88 MCG PO TABS
88.0000 ug | ORAL_TABLET | Freq: Every morning | ORAL | 3 refills | Status: AC
  Filled 2024-10-06: qty 90, 90d supply, fill #0
